# Patient Record
Sex: Male | Born: 1965 | Race: White | Hispanic: No | Marital: Married | Smoking: Never smoker
Health system: Southern US, Community
[De-identification: ages and names within clinical notes are randomized; demographics above are authoritative.]

## PROBLEM LIST (undated history)

## (undated) DIAGNOSIS — E785 Hyperlipidemia, unspecified: Secondary | ICD-10-CM

## (undated) DIAGNOSIS — M549 Dorsalgia, unspecified: Secondary | ICD-10-CM

## (undated) DIAGNOSIS — IMO0001 Reserved for inherently not codable concepts without codable children: Secondary | ICD-10-CM

## (undated) DIAGNOSIS — J309 Allergic rhinitis, unspecified: Secondary | ICD-10-CM

## (undated) DIAGNOSIS — E8881 Metabolic syndrome: Secondary | ICD-10-CM

## (undated) DIAGNOSIS — R7401 Elevation of levels of liver transaminase levels: Secondary | ICD-10-CM

## (undated) DIAGNOSIS — R600 Localized edema: Secondary | ICD-10-CM

## (undated) DIAGNOSIS — J329 Chronic sinusitis, unspecified: Secondary | ICD-10-CM

## (undated) DIAGNOSIS — R5381 Other malaise: Secondary | ICD-10-CM

## (undated) DIAGNOSIS — R740 Nonspecific elevation of levels of transaminase and lactic acid dehydrogenase [LDH]: Secondary | ICD-10-CM

## (undated) DIAGNOSIS — R5383 Other fatigue: Secondary | ICD-10-CM

## (undated) DIAGNOSIS — R7303 Prediabetes: Secondary | ICD-10-CM

## (undated) DIAGNOSIS — E119 Type 2 diabetes mellitus without complications: Secondary | ICD-10-CM

## (undated) DIAGNOSIS — I10 Essential (primary) hypertension: Secondary | ICD-10-CM

## (undated) DIAGNOSIS — M2550 Pain in unspecified joint: Secondary | ICD-10-CM

## (undated) DIAGNOSIS — M160 Bilateral primary osteoarthritis of hip: Secondary | ICD-10-CM

## (undated) HISTORY — DX: Chronic sinusitis, unspecified: J32.9

## (undated) HISTORY — DX: Metabolic syndrome: E88.81

## (undated) HISTORY — DX: Nonspecific elevation of levels of transaminase and lactic acid dehydrogenase (ldh): R74.0

## (undated) HISTORY — DX: Hyperlipidemia, unspecified: E78.5

## (undated) HISTORY — DX: Elevation of levels of liver transaminase levels: R74.01

## (undated) HISTORY — DX: Type 2 diabetes mellitus without complications: E11.9

## (undated) HISTORY — DX: Prediabetes: R73.03

## (undated) HISTORY — DX: Other fatigue: R53.83

## (undated) HISTORY — DX: Bilateral primary osteoarthritis of hip: M16.0

## (undated) HISTORY — PX: SHOULDER SURGERY: SHX246

## (undated) HISTORY — DX: Dorsalgia, unspecified: M54.9

## (undated) HISTORY — DX: Reserved for inherently not codable concepts without codable children: IMO0001

## (undated) HISTORY — PX: TOTAL HIP ARTHROPLASTY: SHX124

## (undated) HISTORY — DX: Essential (primary) hypertension: I10

## (undated) HISTORY — DX: Localized edema: R60.0

## (undated) HISTORY — DX: Pain in unspecified joint: M25.50

## (undated) HISTORY — DX: Other malaise: R53.81

## (undated) HISTORY — DX: Allergic rhinitis, unspecified: J30.9

---

## 1998-06-07 ENCOUNTER — Ambulatory Visit (HOSPITAL_COMMUNITY): Admission: RE | Admit: 1998-06-07 | Discharge: 1998-06-07 | Payer: Self-pay | Admitting: Gynecology

## 2003-07-23 ENCOUNTER — Encounter: Payer: Self-pay | Admitting: Internal Medicine

## 2004-09-05 ENCOUNTER — Ambulatory Visit: Payer: Self-pay | Admitting: Internal Medicine

## 2005-07-17 ENCOUNTER — Ambulatory Visit: Payer: Self-pay | Admitting: Internal Medicine

## 2005-08-14 ENCOUNTER — Ambulatory Visit: Payer: Self-pay | Admitting: Internal Medicine

## 2006-05-20 ENCOUNTER — Ambulatory Visit: Payer: Self-pay | Admitting: Internal Medicine

## 2006-09-01 ENCOUNTER — Ambulatory Visit: Payer: Self-pay | Admitting: Internal Medicine

## 2007-01-03 ENCOUNTER — Ambulatory Visit: Payer: Self-pay | Admitting: Internal Medicine

## 2007-05-12 DIAGNOSIS — I10 Essential (primary) hypertension: Secondary | ICD-10-CM | POA: Insufficient documentation

## 2007-05-12 DIAGNOSIS — J309 Allergic rhinitis, unspecified: Secondary | ICD-10-CM

## 2007-05-12 HISTORY — DX: Allergic rhinitis, unspecified: J30.9

## 2007-10-21 ENCOUNTER — Ambulatory Visit: Payer: Self-pay | Admitting: Internal Medicine

## 2008-02-17 ENCOUNTER — Ambulatory Visit: Payer: Self-pay | Admitting: Internal Medicine

## 2008-02-17 ENCOUNTER — Telehealth: Payer: Self-pay | Admitting: *Deleted

## 2008-03-22 ENCOUNTER — Ambulatory Visit: Payer: Self-pay | Admitting: Internal Medicine

## 2008-03-22 LAB — CONVERTED CEMR LAB
Albumin: 4.2 g/dL (ref 3.5–5.2)
Bilirubin Urine: NEGATIVE
Bilirubin, Direct: 0.1 mg/dL (ref 0.0–0.3)
CO2: 30 meq/L (ref 19–32)
Cholesterol: 215 mg/dL (ref 0–200)
Creatinine, Ser: 1.1 mg/dL (ref 0.4–1.5)
Direct LDL: 150 mg/dL
Eosinophils Absolute: 0.1 10*3/uL (ref 0.0–0.7)
Eosinophils Relative: 1.4 % (ref 0.0–5.0)
Glucose, Urine, Semiquant: NEGATIVE
HDL: 37.2 mg/dL — ABNORMAL LOW (ref >39.0)
Ketones, urine, test strip: NEGATIVE
Monocytes Absolute: 0.6 10*3/uL (ref 0.1–1.0)
Nitrite: NEGATIVE
Potassium: 4.3 meq/L (ref 3.5–5.1)
Protein, U semiquant: NEGATIVE
Sodium: 140 meq/L (ref 135–145)
Specific Gravity, Urine: <1.005
TSH: 1.94 microintl units/mL (ref 0.35–5.50)
Total Bilirubin: 0.7 mg/dL (ref 0.3–1.2)
Triglycerides: 193 mg/dL — ABNORMAL HIGH (ref 0–149)
Urobilinogen, UA: 0.2
WBC Urine, dipstick: NEGATIVE
WBC: 8.6 10*3/uL (ref 4.5–10.5)

## 2008-03-30 ENCOUNTER — Ambulatory Visit: Payer: Self-pay | Admitting: Internal Medicine

## 2008-07-18 ENCOUNTER — Emergency Department (HOSPITAL_BASED_OUTPATIENT_CLINIC_OR_DEPARTMENT_OTHER): Admission: EM | Admit: 2008-07-18 | Discharge: 2008-07-18 | Payer: Self-pay | Admitting: Emergency Medicine

## 2008-07-23 ENCOUNTER — Ambulatory Visit: Payer: Self-pay | Admitting: Internal Medicine

## 2008-07-23 LAB — CONVERTED CEMR LAB
Alkaline Phosphatase: 59 units/L (ref 39–117)
Cholesterol: 215 mg/dL (ref 0–200)
Direct LDL: 160.1 mg/dL
Total CHOL/HDL Ratio: 6.3
Triglycerides: 112 mg/dL (ref 0–149)
VLDL: 22 mg/dL (ref 0–40)

## 2008-07-30 ENCOUNTER — Ambulatory Visit: Payer: Self-pay | Admitting: Internal Medicine

## 2008-07-30 DIAGNOSIS — E785 Hyperlipidemia, unspecified: Secondary | ICD-10-CM

## 2009-01-28 ENCOUNTER — Ambulatory Visit: Payer: Self-pay | Admitting: Internal Medicine

## 2009-01-29 LAB — CONVERTED CEMR LAB
Albumin: 4.2 g/dL (ref 3.5–5.2)
Alkaline Phosphatase: 68 units/L (ref 39–117)
Bilirubin, Direct: 0.1 mg/dL (ref 0.0–0.3)
CO2: 27 meq/L (ref 19–32)
Cholesterol: 218 mg/dL — ABNORMAL HIGH (ref 0–200)
Creatinine, Ser: 1.3 mg/dL (ref 0.4–1.5)
GFR calc non Af Amer: 64.83 mL/min (ref >60)
Total Protein: 6.9 g/dL (ref 6.0–8.3)
VLDL: 21 mg/dL (ref 0.0–40.0)

## 2009-03-13 ENCOUNTER — Telehealth: Payer: Self-pay | Admitting: Internal Medicine

## 2009-09-14 DIAGNOSIS — R5381 Other malaise: Secondary | ICD-10-CM

## 2009-09-14 DIAGNOSIS — R5383 Other fatigue: Secondary | ICD-10-CM

## 2009-09-14 HISTORY — DX: Other fatigue: R53.83

## 2009-09-14 HISTORY — DX: Other malaise: R53.81

## 2011-02-21 LAB — PULMONARY FUNCTION TEST

## 2011-09-03 ENCOUNTER — Ambulatory Visit (INDEPENDENT_AMBULATORY_CARE_PROVIDER_SITE_OTHER): Payer: Self-pay | Admitting: Family Medicine

## 2011-09-03 ENCOUNTER — Encounter: Payer: Self-pay | Admitting: Family Medicine

## 2011-09-03 DIAGNOSIS — E785 Hyperlipidemia, unspecified: Secondary | ICD-10-CM

## 2011-09-03 DIAGNOSIS — I10 Essential (primary) hypertension: Secondary | ICD-10-CM

## 2011-09-03 MED ORDER — AMLODIPINE BESYLATE 10 MG PO TABS: 10.0000 mg | ORAL_TABLET | Freq: Every day | ORAL | Status: DC

## 2011-09-03 NOTE — Progress Notes (Addendum)
Office Note 09/06/2011  CC:  Chief Complaint  Patient presents with  . Establish Care    new patient    HPI:  Vernon Mccormick is a 45 y.o. White male who is here to establish care. Patient's most recent primary MD: Synthia Innocent, [place] in [place].  Prior to that he saw Dr. Phoebe Sharps with [facility] on [address] briefly. Old records in [facility] EMR were reviewed prior to or during today's visit.  Presents today saying he had been treated for HTN with lisinopril/HCTZ.  He reports tolerating this med for several years before developing generalized muscle soreness about 1 yr ago, which he attributes to the med.  He finally stopped the med 07/2011 and says that ALL of his muscle soreness has resolved! Denies unexplained fevers, wt loss, HA's, rash, or joint swelling/erythema.  Has not been any other med during this time.  Says bp was well controlled while on lisin/hctz.  Reports having normal EKG 02/2011.  Says he thinks he had Tdap in 2009.  Has had flu vaccine this season.  Past Medical History  Diagnosis Date  . Hypertension   . Hyperlipidemia   . Allergic rhinitis    PSH: none No past surgical history on file.  Family History  Problem Relation Age of Onset  . Hypertension Father   . Stroke Paternal Grandfather   . Heart disease Paternal Grandfather   . Hypertension Paternal Grandfather   . Diabetes Paternal Grandfather   . Kidney disease Paternal Grandfather     History   Social History  . Marital Status: Married    Spouse Name: N/A    Number of Children: N/A  . Years of Education: N/A   Occupational History  . Not on file.   Social History Main Topics  . Smoking status: Never Smoker   . Smokeless tobacco: Former Systems developer    Types: Chew  . Alcohol Use: Yes     socially  . Drug Use: No  . Sexually Active: Not on file   Other Topics Concern  . Not on file   Social History Narrative   Married, 2 kids.Long time local resident, grad of [employer].  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  Rare/social ETOH.  No drugs.   MEDS: currently none (amlodipine added AFTER today's encounter). Outpatient Encounter Prescriptions as of 09/03/2011  Medication Sig Dispense Refill  . amLODipine (NORVASC) 10 MG tablet Take 1 tablet (10 mg total) by mouth daily.  30 tablet  0    Allergies  Allergen Reactions  . Lisinopril     Aches and pains    ROS Review of Systems  Constitutional: Negative for fever, weight loss and malaise/fatigue.  HENT: Negative for hearing loss, nosebleeds, sore throat, neck pain and tinnitus.   Eyes: Negative for blurred vision and pain.  Respiratory: Negative for cough, shortness of breath and wheezing.   Cardiovascular: Negative for chest pain, palpitations, claudication and PND.  Gastrointestinal: Negative for nausea, diarrhea, constipation and blood in stool.  Genitourinary: Negative for urgency, hematuria and flank pain.  Musculoskeletal: Positive for myalgias (as per HPI--now resolved). Negative for back pain and joint pain.  Skin: Negative for rash.  Neurological: Negative for dizziness, seizures, weakness and headaches.  Endo/Heme/Allergies: Does not bruise/bleed easily.  Psychiatric/Behavioral: Negative for depression and memory loss.   b PE; Blood pressure 160/105, pulse 64, temperature 98.3 F (36.8 C), temperature source Oral, height 5' 11"  (1.803 m), weight 253 lb 12.8 oz (115.123 kg),  SpO2 98.00%. Gen: Alert, well appearing.  Patient is oriented to person, place, time, and situation. ENT: Ears: EACs clear, normal epithelium.  TMs with good light reflex and landmarks bilaterally.  Eyes: no injection, icteris, swelling, or exudate.  EOMI, PERRLA. Nose: no drainage or turbinate edema/swelling.  No injection or focal lesion.  Mouth: lips without lesion/swelling.  Oral mucosa pink and moist.  Dentition intact and without obvious caries or gingival swelling.  Oropharynx without erythema, exudate,  or swelling.  Neck - No masses or thyromegaly or limitation in range of motion CV: RRR, no m/r/g.   LUNGS: CTA bilat, nonlabored resps, good aeration in all lung fields. ABD: soft, NT, ND, BS normal.  No hepatospenomegaly or mass.  No bruits. EXT: no clubbing, cyanosis, or edema.  Skin - no sores or suspicious lesions or rashes or color changes  Pertinent labs:  None today  ASSESSMENT AND PLAN:   New pt: obtain old records.  HYPERTENSION Poor control.  Offf meds currently. Needs change in med due to question of side effect from lisinopril/HCTZ. Start amlodipine 58m qd.   No ASA for now.  He'll call with report of outside bp's in 7-10 d (bp goal <140/90).  HYPERLIPIDEMIA Lipids 01/2009 showed LDL a bit over and HDL a bit under his goals of 160 and 40, respectively, plus his AST and ALT were mildly elevated. However, he likely has more UTD labs via his records with JSynthia Innocent FNP.  Obtain old records.   No labs today.     Myalgias: resolved, believed to be EXTREMELY delayed side effect of lisin/hctz (?).  Return for 10-14d f/u for HTN.    ADDENDUM 09/14/11: [place] given his CV risk factors. At his next f/u I will recommend advanced lipid testing to see if any additional risk picked up on these tests.  Otherwise, emphasize TLC and recheck lipids 1 yr.--PM

## 2011-09-06 ENCOUNTER — Telehealth: Payer: Self-pay | Admitting: Family Medicine

## 2011-09-06 ENCOUNTER — Encounter: Payer: Self-pay | Admitting: Family Medicine

## 2011-09-06 NOTE — Assessment & Plan Note (Signed)
Lipids 01/2009 showed LDL a bit over and HDL a bit under his goals of 160 and 40, respectively, plus his AST and ALT were mildly elevated. However, he likely has more UTD labs via his records with Synthia Innocent, FNP.  Obtain old records.   No labs today.

## 2011-09-06 NOTE — Assessment & Plan Note (Signed)
Poor control.  Offf meds currently. Needs change in med due to question of side effect from lisinopril/HCTZ. Start amlodipine 67m qd.   No ASA for now.  He'll call with report of outside bp's in 7-10 d (bp goal <140/90).

## 2011-09-06 NOTE — Telephone Encounter (Signed)
Pls request records from Synthia Innocent, FNP ([place]).Marland Kitchen

## 2011-09-10 NOTE — Telephone Encounter (Signed)
Faxed 09/10/11

## 2011-09-14 ENCOUNTER — Encounter: Payer: Self-pay | Admitting: Family Medicine

## 2011-09-17 ENCOUNTER — Ambulatory Visit (INDEPENDENT_AMBULATORY_CARE_PROVIDER_SITE_OTHER): Payer: BC Managed Care – PPO | Admitting: Family Medicine

## 2011-09-17 ENCOUNTER — Encounter: Payer: Self-pay | Admitting: Family Medicine

## 2011-09-17 VITALS — BP 138/100 | HR 59 | Temp 97.6°F | Wt 254.0 lb

## 2011-09-17 DIAGNOSIS — I10 Essential (primary) hypertension: Secondary | ICD-10-CM

## 2011-09-17 DIAGNOSIS — R609 Edema, unspecified: Secondary | ICD-10-CM

## 2011-09-17 DIAGNOSIS — R3129 Other microscopic hematuria: Secondary | ICD-10-CM

## 2011-09-17 DIAGNOSIS — E785 Hyperlipidemia, unspecified: Secondary | ICD-10-CM

## 2011-09-17 LAB — CBC WITH DIFFERENTIAL/PLATELET
Basophils Absolute: 0 10*3/uL (ref 0.0–0.1)
Basophils Relative: 0.5 % (ref 0.0–3.0)
Eosinophils Absolute: 0.1 10*3/uL (ref 0.0–0.7)
Eosinophils Relative: 1.6 % (ref 0.0–5.0)
Hemoglobin: 16.8 g/dL (ref 13.0–17.0)
MCHC: 33.5 g/dL (ref 30.0–36.0)
Monocytes Absolute: 0.4 10*3/uL (ref 0.1–1.0)
Monocytes Relative: 5.5 % (ref 3.0–12.0)
Neutro Abs: 4.5 10*3/uL (ref 1.4–7.7)
Neutrophils Relative %: 57.2 % (ref 43.0–77.0)
Platelets: 203 10*3/uL (ref 150.0–400.0)
RBC: 5.71 Mil/uL (ref 4.22–5.81)
RDW: 13.3 % (ref 11.5–14.6)
WBC: 7.8 10*3/uL (ref 4.5–10.5)

## 2011-09-17 LAB — POCT URINALYSIS DIPSTICK
Bilirubin, UA: NEGATIVE
Leukocytes, UA: NEGATIVE
Nitrite, UA: NEGATIVE
Urobilinogen, UA: 0.2

## 2011-09-17 LAB — URINALYSIS, ROUTINE W REFLEX MICROSCOPIC
Bilirubin Urine: NEGATIVE
Ketones, ur: NEGATIVE
Leukocytes, UA: NEGATIVE
Nitrite: NEGATIVE
Specific Gravity, Urine: >=1.03 (ref 1.000–1.030)
Urine Glucose: NEGATIVE
pH: 6 (ref 5.0–8.0)

## 2011-09-17 LAB — COMPREHENSIVE METABOLIC PANEL
Albumin: 4.3 g/dL (ref 3.5–5.2)
Creatinine, Ser: 1 mg/dL (ref 0.4–1.5)
GFR: 87.65 mL/min (ref >60.00)
Total Protein: 6.9 g/dL (ref 6.0–8.3)

## 2011-09-17 LAB — LIPID PANEL
Triglycerides: 82 mg/dL (ref 0.0–149.0)
VLDL: 16.4 mg/dL (ref 0.0–40.0)

## 2011-09-17 MED ORDER — LOSARTAN POTASSIUM 50 MG PO TABS: 50.0000 mg | ORAL_TABLET | Freq: Every day | ORAL | Status: DC

## 2011-09-17 NOTE — Progress Notes (Signed)
OFFICE NOTE  09/18/2011  CC:  Chief Complaint  Patient presents with  . Follow-up    hypertension     HPI:   Patient is a 46 y.o. Caucasian male who is here for f/u HTN, started amlodipine 43m qd about 2 wks ago. About 4-5 days after starting it, he went on a vacation and walked a lot and noted a lot of swelling and a mildly itchy pinkish rash come out on both lower legs.  He stopped the amlodipine yesterday morning.  Home bp checks while on the med were still high: 140s-150s over 80s-90s.  He is still having some HA's.  Pertinent PMH:  Past Medical History  Diagnosis Date  . Hypertension   . Hyperlipidemia   . Allergic rhinitis   . Other malaise and fatigue 2011    Testosterone normal 2011     MEDS;   Outpatient Prescriptions Prior to Visit  Medication Sig Dispense Refill  . amLODipine (NORVASC) 10 MG tablet Take 1 tablet (10 mg total) by mouth daily.  30 tablet  0    PE: Blood pressure 138/100, pulse 59, temperature 97.6 F (36.4 C), temperature source Temporal, weight 254 lb (115.214 kg). Gen: Alert, well appearing.  Patient is oriented to person, place, time, and situation. ENT: Ears: EACs clear, normal epithelium.  TMs with good light reflex and landmarks bilaterally.  Eyes: no injection, icteris, swelling, or exudate.  EOMI, PERRLA. Nose: no drainage or turbinate edema/swelling.  No injection or focal lesion.  Mouth: lips without lesion/swelling.  Oral mucosa pink and moist.  Dentition intact and without obvious caries or gingival swelling.  Oropharynx without erythema, exudate, or swelling.  Neck - No masses or thyromegaly or limitation in range of motion CV: RRR, no m/r/g.   LUNGS: CTA bilat, nonlabored resps, good aeration in all lung fields. ABD: soft, NT EXT: 1-2 + pitting edema from the knees down into feet bilat, also on medial ankles he has a nonblanching pinkish splotchy rash--appears petechial. Nontender.  No ecchymoses, no nodules or pustules or  hives.  LAB: CC UA today: small blood, SG>1.030, otherwise normal/negative  IMPRESSION AND PLAN:  HYPERTENSION BP not well controlled on amlodipine PLUS he had adverse effects of LE edema and rash. D/C amlodipine and start cozaar generic 524monce daily. Monitor bp.  Recheck in office in 2 wks to make sure bp improving and hopefully document resolution of edema and rash.  HYPERLIPIDEMIA Discussed the fact that he is essentially at goal (per old record chart review) and I recommend advanced lipid testing and he is agreeable to this. He also wants routine CBC, CMEt, and TSH today.  Microscopic hematuria On dipstick in office here today. Will send sample for UA with micro to confirm.     FOLLOW UP:  Return in about 2 weeks (around 10/01/2011) for f/u HTN and LE swelling/rash.

## 2011-09-18 ENCOUNTER — Ambulatory Visit: Payer: BC Managed Care – PPO | Admitting: Family Medicine

## 2011-09-18 DIAGNOSIS — R3129 Other microscopic hematuria: Secondary | ICD-10-CM | POA: Insufficient documentation

## 2011-09-18 NOTE — Assessment & Plan Note (Signed)
BP not well controlled on amlodipine PLUS he had adverse effects of LE edema and rash. D/C amlodipine and start cozaar generic 10m once daily. Monitor bp.  Recheck in office in 2 wks to make sure bp improving and hopefully document resolution of edema and rash.

## 2011-09-18 NOTE — Assessment & Plan Note (Signed)
Discussed the fact that he is essentially at goal (per old record chart review) and I recommend advanced lipid testing and he is agreeable to this. He also wants routine CBC, CMEt, and TSH today.

## 2011-09-18 NOTE — Assessment & Plan Note (Signed)
On dipstick in office here today. Will send sample for UA with micro to confirm.

## 2011-09-27 ENCOUNTER — Telehealth: Payer: Self-pay | Admitting: Family Medicine

## 2011-09-27 NOTE — Telephone Encounter (Signed)
Pls notify pt that his advanced lipid testing showed some interesting results.  He has excessive cardiovascular risk and my recommendations are:  1) Start simvastatin 58m qd, recheck lipids 340mogoal LDL <100, goal HDL >50, and goal triglycerides <100). 2) Start Aspirin 8134m"baby" aspirin).  IF he already takes this (chart doesn't show this) then increase to a 325m92mpirin tab once daily. We'll repeat the urine test he did with his recent advanced lipid testing in 3 mo. 3) His particular genetic testing indicated that a low fat and low cholesterol diet are even MORE important for him than other people, and that Fish oil supplements may actually be BAD for him (it may increase his "bad cholesterol" too much).  Also, exercise, exercise, exercise! If he agrees to simvastatin, please eRx this for him: 20mg97mqd, #30, RF x 2.  thx--

## 2011-09-28 MED ORDER — SIMVASTATIN 20 MG PO TABS: 20.0000 mg | ORAL_TABLET | Freq: Every day | ORAL | Status: DC

## 2011-09-28 NOTE — Telephone Encounter (Signed)
I have attempted to contact this patient by phone with the following results: left message to return my call on answering machine (mobile).  

## 2011-09-28 NOTE — Telephone Encounter (Signed)
RC from pt.  He is agreeable with all recommendations.  He can discuss more at appt on Thursday.  RX for simvastatin sent to pharm.

## 2011-10-01 ENCOUNTER — Encounter: Payer: Self-pay | Admitting: Family Medicine

## 2011-10-01 ENCOUNTER — Ambulatory Visit (INDEPENDENT_AMBULATORY_CARE_PROVIDER_SITE_OTHER): Payer: BC Managed Care – PPO | Admitting: Family Medicine

## 2011-10-01 DIAGNOSIS — I10 Essential (primary) hypertension: Secondary | ICD-10-CM

## 2011-10-01 DIAGNOSIS — R3129 Other microscopic hematuria: Secondary | ICD-10-CM

## 2011-10-01 DIAGNOSIS — E785 Hyperlipidemia, unspecified: Secondary | ICD-10-CM

## 2011-10-01 DIAGNOSIS — IMO0001 Reserved for inherently not codable concepts without codable children: Secondary | ICD-10-CM

## 2011-10-01 DIAGNOSIS — E669 Obesity, unspecified: Secondary | ICD-10-CM

## 2011-10-01 DIAGNOSIS — R609 Edema, unspecified: Secondary | ICD-10-CM

## 2011-10-01 NOTE — Progress Notes (Signed)
OFFICE NOTE  10/03/2011  CC:  Chief Complaint  Patient presents with  . Follow-up    BP, rash better     HPI:   Patient is a 46 y.o. Caucasian male who is here for 2 wk HTN and hyperlipidemia f/u. Changed bp med to losartan last visit due to amlodipine causing LE swelling/rash.  The swelling and rash are completely gone now since getting off amlodipine.  No SE's from losartan. BP at home 130s/80s-90s. Started zocor just recently when HDL labs returned showing excessive CV risk. Discussed his weight in depth today, went over basics of lifestyle modifications that he can sustain.  Pertinent PMH:  Past Medical History  Diagnosis Date  . Hypertension   . Hyperlipidemia   . Allergic rhinitis   . Other malaise and fatigue 2011    Testosterone normal 2011   Past surgical, social, and family history reviewed and no changes noted since last office visit.  MEDS;   Outpatient Prescriptions Prior to Visit  Medication Sig Dispense Refill  . losartan (COZAAR) 50 MG tablet Take 1 tablet (50 mg total) by mouth daily.  30 tablet  1  . simvastatin (ZOCOR) 20 MG tablet Take 1 tablet (20 mg total) by mouth at bedtime.  30 tablet  2    PE: Blood pressure 131/93, pulse 63, height 5' 11"  (1.803 m), weight 253 lb (114.76 kg). Gen: Alert, well appearing.  Patient is oriented to person, place, time, and situation. CV: RRR, no m/r/g.   LUNGS: CTA bilat, nonlabored resps, good aeration in all lung fields. EXT: no clubbing, cyanosis, or edema.  No rash.  IMPRESSION AND PLAN:  HYPERTENSION Much improved. Continue current med.  Continue home monitoring.  Edema Side effect of amlodipine--resolved off this med.  Microscopic hematuria Discussed in depth today. We'll repeat at next f/u in 32moto see if we can establish that it is persistent before proceeding with any further w/u.  HYPERLIPIDEMIA Recently started simvastatin and he's tolerating this fine. Recheck lipids 350mo Discussed  dietary/lifestyle changes.  Obesity, Class II, BMI 35-39.9, with comorbidity Discussed goal: wt loss of 33 lbs over the next year or so (goal wt of 220 lbs).     FOLLOW UP:  Return in about 3 months (around 12/30/2011) for f/u HTN and hyperlip--needs fasting AM appt.

## 2011-10-03 DIAGNOSIS — IMO0001 Reserved for inherently not codable concepts without codable children: Secondary | ICD-10-CM | POA: Insufficient documentation

## 2011-10-03 NOTE — Assessment & Plan Note (Signed)
Recently started simvastatin and he's tolerating this fine. Recheck lipids 22mo  Discussed dietary/lifestyle changes.

## 2011-10-03 NOTE — Assessment & Plan Note (Signed)
Side effect of amlodipine--resolved off this med.

## 2011-10-03 NOTE — Assessment & Plan Note (Signed)
Much improved. Continue current med.  Continue home monitoring.

## 2011-10-03 NOTE — Assessment & Plan Note (Signed)
Discussed goal: wt loss of 33 lbs over the next year or so (goal wt of 220 lbs).

## 2011-10-03 NOTE — Assessment & Plan Note (Signed)
Discussed in depth today. We'll repeat at next f/u in 16moto see if we can establish that it is persistent before proceeding with any further w/u.

## 2011-10-05 ENCOUNTER — Encounter: Payer: Self-pay | Admitting: Family Medicine

## 2011-11-02 ENCOUNTER — Encounter: Payer: Self-pay | Admitting: Family Medicine

## 2011-11-02 ENCOUNTER — Ambulatory Visit (INDEPENDENT_AMBULATORY_CARE_PROVIDER_SITE_OTHER): Payer: BC Managed Care – PPO | Admitting: Family Medicine

## 2011-11-02 VITALS — BP 132/90 | HR 71 | Temp 97.5°F | Ht 71.0 in | Wt 260.0 lb

## 2011-11-02 DIAGNOSIS — J019 Acute sinusitis, unspecified: Secondary | ICD-10-CM

## 2011-11-02 MED ORDER — AMOXICILLIN 875 MG PO TABS: 875.0000 mg | ORAL_TABLET | Freq: Two times a day (BID) | ORAL | Status: AC

## 2011-11-02 NOTE — Assessment & Plan Note (Signed)
Amoxil 833m bid x 10d. Saline nasal spray 2-3 times per day encouraged. OTC nonsedating antihistamine w/out decongestant recommended.

## 2011-11-02 NOTE — Patient Instructions (Signed)
Buy small box of allegra 126m OR zyrtec 146m OR claritin 1059mnd take as instructed. If cough: take mucinex DM or robitussin DM as instructed.

## 2011-11-02 NOTE — Progress Notes (Signed)
OFFICE NOTE  11/02/2011  CC:  Chief Complaint  Patient presents with  . URI    x 1.5 weeks, nasal drainage, not taking any meds, no fever, feels like moving into chest     HPI: Patient is a 46 y.o. Caucasian male who is here for "sick". Pt presents complaining of respiratory symptoms for 11  days.  Mostly nasal congestion/runny nose, sneezing,  occ PND cough.  Worst symptoms seems to be the facial/nasal congestion.  Lately the symptoms seem to be staying same.  No signif cough. No fevers, no wheezing, and no SOB.  No pain in face or teeth.  No significant HA.  ST mild at most.  Symptoms made worse by nothing.  Symptoms improved by hot shower.. Smoker? no Recent sick contact? no Muscle or joint aches? no Flu shot this season at least 2 wks ago? yes  ROS: no n/v/d or abdominal pain.  No rash.  No neck stiffness.   +Mild fatigue.  +Mild appetite loss.   Pertinent PMH:  Past Medical History  Diagnosis Date  . Hypertension   . Hyperlipidemia   . Allergic rhinitis   . Other malaise and fatigue 2011    Testosterone normal 2011   Past surgical, social, and family history reviewed and no changes noted since last office visit.  MEDS:  Outpatient Prescriptions Prior to Visit  Medication Sig Dispense Refill  . losartan (COZAAR) 50 MG tablet Take 1 tablet (50 mg total) by mouth daily.  30 tablet  1  . simvastatin (ZOCOR) 20 MG tablet Take 1 tablet (20 mg total) by mouth at bedtime.  30 tablet  2    PE: Blood pressure 132/90, pulse 71, temperature 97.5 F (36.4 C), temperature source Oral, height 5' 11"  (1.803 m), weight 260 lb (117.935 kg), SpO2 95.00%. VS: noted--normal. Gen: alert, NAD, NONTOXIC APPEARING. HEENT: eyes without injection, drainage, or swelling.  Ears: EACs clear, TMs with normal light reflex and landmarks.  Nose: Clear rhinorrhea, with some dried, crusty exudate adherent to mildly injected mucosa.  No purulent d/c.  +maxillary sinus TTP , L>R.  No facial swelling.   Throat and mouth without focal lesion.  No pharyngial swelling, erythema, or exudate.   Neck: supple, no LAD.   LUNGS: CTA bilat, nonlabored resps.   CV: RRR, no m/r/g. EXT: no c/c/e SKIN: no rash    IMPRESSION AND PLAN: Sinusitis acute Amoxil 853m bid x 10d. Saline nasal spray 2-3 times per day encouraged. OTC nonsedating antihistamine w/out decongestant recommended.      FOLLOW UP: prn

## 2011-11-13 ENCOUNTER — Encounter: Payer: Self-pay | Admitting: Family Medicine

## 2011-11-13 ENCOUNTER — Other Ambulatory Visit: Payer: Self-pay | Admitting: *Deleted

## 2011-11-13 ENCOUNTER — Ambulatory Visit (INDEPENDENT_AMBULATORY_CARE_PROVIDER_SITE_OTHER): Payer: BC Managed Care – PPO | Admitting: Family Medicine

## 2011-11-13 VITALS — BP 143/99 | HR 71 | Temp 98.2°F | Ht 71.0 in | Wt 259.0 lb

## 2011-11-13 DIAGNOSIS — R3129 Other microscopic hematuria: Secondary | ICD-10-CM

## 2011-11-13 DIAGNOSIS — E785 Hyperlipidemia, unspecified: Secondary | ICD-10-CM

## 2011-11-13 DIAGNOSIS — R609 Edema, unspecified: Secondary | ICD-10-CM

## 2011-11-13 DIAGNOSIS — J329 Chronic sinusitis, unspecified: Secondary | ICD-10-CM

## 2011-11-13 DIAGNOSIS — I10 Essential (primary) hypertension: Secondary | ICD-10-CM

## 2011-11-13 MED ORDER — HYDROCHLOROTHIAZIDE 25 MG PO TABS: 25.0000 mg | ORAL_TABLET | Freq: Every day | ORAL | Status: DC

## 2011-11-13 MED ORDER — LOSARTAN POTASSIUM 50 MG PO TABS: 50.0000 mg | ORAL_TABLET | Freq: Every day | ORAL | Status: DC

## 2011-11-13 MED ORDER — AMOXICILLIN-POT CLAVULANATE 875-125 MG PO TABS: 1.0000 | ORAL_TABLET | Freq: Two times a day (BID) | ORAL | Status: AC

## 2011-11-13 NOTE — Telephone Encounter (Signed)
Pt has follow up 12/2011.  RX sent x 2.

## 2011-11-13 NOTE — Progress Notes (Signed)
OFFICE NOTE  11/13/2011  CC:  Chief Complaint  Patient presents with  . Sinusitis    no better     HPI: Patient is a 46 y.o. Caucasian male who is here for 2 wk f/u sinusitis, "no better". Two weeks ago I dx'd him with sinusitis and rx'd 10d course of amoxil 559m tid.  Took all meds, was about 50% improved but then last night got acutely worse: purulent nasal mucous/face pain, PND cough, tired. No fever.  No wheezing or SOB.  Of note, has hx of musculoskeletal pain that was thought to be from his lisinopril/hctz combo pill in the past, so he was switched to cozaar and sx's abated.  Says HCTZ helped bp a lot in the past. Home bp checks consistent with today's check interspersed with some in normal range. No HA, no CP, no palpitations, no dizziness.  Pertinent PMH:  Recurrent sinusitis HTN Hyperlipidemia Obesity, BMI 35-39.9 No hx of tobacco abuse  MEDS:  Cozaar 533mqd, simvastatin 2078md  PE: Blood pressure 143/99, pulse 71, temperature 98.2 F (36.8 C), temperature source Temporal, height 5' 11"  (1.803 m), weight 259 lb (117.482 kg). VS: noted--normal. Gen: alert, NAD, well appearing. HEENT: eyes without injection, drainage, or swelling.  Ears: EACs clear, TMs with normal light reflex and landmarks.  Nose:Some dried, crusty exudate adherent to mildly injected mucosa.  No purulent d/c. Mild diffuse paranasal sinus TTP.  No facial swelling.  Throat and mouth without focal lesion.  No pharyngial swelling, erythema, or exudate.   Neck: supple, no LAD.   LUNGS: CTA bilat, nonlabored resps.   CV: RRR, no m/r/g. EXT: no c/c/e SKIN: no rash  IMPRESSION AND PLAN:  Recurrent sinusitis Augmentin 875m10md x 14d. Saline nasal mist encouraged.  Avoid decongestants due to HTN.  Hypertension Control not ideal.  Will add HCTZ 25mg21mk on.  If he tolerates this well and bp well controlled, we can get him on combo cozaar/hctz pill. Cont. Heart healthy diet, increase  exercise.      FOLLOW UP: already has f/u scheduled for April 2013.

## 2011-11-16 DIAGNOSIS — J329 Chronic sinusitis, unspecified: Secondary | ICD-10-CM

## 2011-11-16 HISTORY — DX: Chronic sinusitis, unspecified: J32.9

## 2011-11-16 NOTE — Assessment & Plan Note (Signed)
Augmentin 852m bid x 14d. Saline nasal mist encouraged.  Avoid decongestants due to HTN.

## 2011-11-16 NOTE — Assessment & Plan Note (Signed)
Control not ideal.  Will add HCTZ 46m back on.  If he tolerates this well and bp well controlled, we can get him on combo cozaar/hctz pill. Cont. Heart healthy diet, increase exercise.

## 2011-12-14 ENCOUNTER — Other Ambulatory Visit: Payer: Self-pay | Admitting: *Deleted

## 2011-12-14 MED ORDER — SIMVASTATIN 20 MG PO TABS: 20.0000 mg | ORAL_TABLET | Freq: Every day | ORAL | Status: DC

## 2011-12-14 MED ORDER — HYDROCHLOROTHIAZIDE 25 MG PO TABS: 25.0000 mg | ORAL_TABLET | Freq: Every day | ORAL | Status: DC

## 2011-12-14 NOTE — Telephone Encounter (Signed)
Faxed refill request received from pharmacy for Simvastatin. Last filled by MD on 09/28/11, 30 x 2 Last seen on 11/13/11  Follow up on 12/29/11.  RX sent.

## 2011-12-14 NOTE — Telephone Encounter (Signed)
Faxed refill request received from pharmacy for HCTZ Last filled by MD on 11/13/11, #30 x 0 Last seen on 11/13/11 Follow up on 12/29/11. RX sent.

## 2011-12-29 ENCOUNTER — Ambulatory Visit (INDEPENDENT_AMBULATORY_CARE_PROVIDER_SITE_OTHER): Payer: BC Managed Care – PPO | Admitting: Family Medicine

## 2011-12-29 ENCOUNTER — Encounter: Payer: Self-pay | Admitting: Family Medicine

## 2011-12-29 VITALS — BP 133/89 | HR 67 | Ht 71.0 in | Wt 258.0 lb

## 2011-12-29 DIAGNOSIS — R609 Edema, unspecified: Secondary | ICD-10-CM

## 2011-12-29 DIAGNOSIS — R3129 Other microscopic hematuria: Secondary | ICD-10-CM

## 2011-12-29 DIAGNOSIS — J329 Chronic sinusitis, unspecified: Secondary | ICD-10-CM

## 2011-12-29 DIAGNOSIS — I10 Essential (primary) hypertension: Secondary | ICD-10-CM

## 2011-12-29 DIAGNOSIS — E785 Hyperlipidemia, unspecified: Secondary | ICD-10-CM

## 2011-12-29 LAB — BASIC METABOLIC PANEL
BUN: 15 mg/dL (ref 6–23)
CO2: 28 mEq/L (ref 19–32)
Chloride: 103 mEq/L (ref 96–112)
Creatinine, Ser: 1.1 mg/dL (ref 0.4–1.5)
Glucose, Bld: 95 mg/dL (ref 70–99)
Potassium: 3.6 mEq/L (ref 3.5–5.1)

## 2011-12-29 LAB — LIPID PANEL
Cholesterol: 166 mg/dL (ref 0–200)
LDL Cholesterol: 97 mg/dL (ref 0–99)
VLDL: 28.8 mg/dL (ref 0.0–40.0)

## 2011-12-29 MED ORDER — LOSARTAN POTASSIUM 50 MG PO TABS: 50.0000 mg | ORAL_TABLET | Freq: Every day | ORAL | Status: DC

## 2011-12-29 MED ORDER — HYDROCHLOROTHIAZIDE 25 MG PO TABS: 25.0000 mg | ORAL_TABLET | Freq: Every day | ORAL | Status: DC

## 2011-12-29 NOTE — Progress Notes (Signed)
OFFICE NOTE  12/29/2011  CC:  Chief Complaint  Patient presents with  . Follow-up    HTN     HPI: Patient is a 46 y.o. Caucasian male who is here for 6 wk follow up HTN. Home bp measurements a few times in March showed 130's/80's.   Too busy to measure lately. Tolerating zocor w/out problem.  Due for repeat lipids today--he is fasting. Plans on starting diet plan soon.  Pertinent PMH:  Past Medical History  Diagnosis Date  . Hypertension   . Hyperlipidemia   . Allergic rhinitis   . Other malaise and fatigue 2011    Testosterone normal 2011   Past surgical, social, and family history reviewed and no changes noted since last office visit.  MEDS:  Outpatient Prescriptions Prior to Visit  Medication Sig Dispense Refill  . hydrochlorothiazide (HYDRODIURIL) 25 MG tablet Take 1 tablet (25 mg total) by mouth daily.  30 tablet  0  . losartan (COZAAR) 50 MG tablet Take 1 tablet (50 mg total) by mouth daily.  30 tablet  1  . simvastatin (ZOCOR) 20 MG tablet Take 1 tablet (20 mg total) by mouth at bedtime.  30 tablet  0    PE: Blood pressure 133/89, pulse 67, height 5' 11"  (1.803 m), weight 258 lb (117.028 kg). Gen: Alert, well appearing.  Patient is oriented to person, place, time, and situation. Neck - No masses or thyromegaly or limitation in range of motion.  Carotids 1+ bilat, no bruit. CV: RRR, no m/r/g.   LUNGS: CTA bilat, nonlabored resps, good aeration in all lung fields. EXT: no clubbing, cyanosis, or edema.   LABS: none today  IMPRESSION AND PLAN:  HYPERTENSION Control adequate. Continue 61m HCTZ qd and losartan 5549mqd. Push diet/exercise.  HYPERLIPIDEMIA Tolerating statin. Recheck lipid panel (fasting) today, AST, ALT. Push diet/exercise.  Recurrent sinusitis Much improved--sx's resolved after augmentin 6 wks ago.      FOLLOW UP:  49m33mou HTN/hyperlip

## 2011-12-29 NOTE — Assessment & Plan Note (Signed)
Tolerating statin. Recheck lipid panel (fasting) today, AST, ALT. Push diet/exercise.

## 2011-12-29 NOTE — Assessment & Plan Note (Signed)
Control adequate. Continue 37m HCTZ qd and losartan 572mqd. Push diet/exercise.

## 2011-12-29 NOTE — Assessment & Plan Note (Signed)
Much improved--sx's resolved after augmentin 6 wks ago.

## 2011-12-30 ENCOUNTER — Encounter: Payer: Self-pay | Admitting: Family Medicine

## 2011-12-30 DIAGNOSIS — R740 Nonspecific elevation of levels of transaminase and lactic acid dehydrogenase [LDH]: Secondary | ICD-10-CM

## 2012-01-06 ENCOUNTER — Telehealth: Payer: Self-pay

## 2012-01-06 NOTE — Progress Notes (Signed)
Theres a note in to dr Anitra Lauth

## 2012-01-06 NOTE — Progress Notes (Signed)
Pt called back but the results that were on Vernon Mccormick screen are gone when I noted and sent it to me?

## 2012-01-06 NOTE — Telephone Encounter (Signed)
Can you please readdress his labs? I left him a message to call him and when he called me back I couldn't find the message you wrote? Patient is aware that you are out of the office until Monday. Thank you

## 2012-01-06 NOTE — Progress Notes (Signed)
Ok, will do these viral hep screens at next f/u--no need to have blood draw again prior to that.--PM

## 2012-01-11 ENCOUNTER — Encounter (HOSPITAL_BASED_OUTPATIENT_CLINIC_OR_DEPARTMENT_OTHER): Payer: Self-pay | Admitting: *Deleted

## 2012-01-11 ENCOUNTER — Emergency Department (HOSPITAL_BASED_OUTPATIENT_CLINIC_OR_DEPARTMENT_OTHER)
Admission: EM | Admit: 2012-01-11 | Discharge: 2012-01-11 | Disposition: A | Discharge status: Home / Self Care | Payer: Worker's Compensation | Attending: Emergency Medicine | Admitting: Emergency Medicine

## 2012-01-11 ENCOUNTER — Emergency Department (INDEPENDENT_AMBULATORY_CARE_PROVIDER_SITE_OTHER): Payer: Worker's Compensation

## 2012-01-11 DIAGNOSIS — M25519 Pain in unspecified shoulder: Secondary | ICD-10-CM | POA: Insufficient documentation

## 2012-01-11 DIAGNOSIS — S46909A Unspecified injury of unspecified muscle, fascia and tendon at shoulder and upper arm level, unspecified arm, initial encounter: Secondary | ICD-10-CM | POA: Insufficient documentation

## 2012-01-11 DIAGNOSIS — M25419 Effusion, unspecified shoulder: Secondary | ICD-10-CM | POA: Insufficient documentation

## 2012-01-11 DIAGNOSIS — I10 Essential (primary) hypertension: Secondary | ICD-10-CM | POA: Insufficient documentation

## 2012-01-11 DIAGNOSIS — W19XXXA Unspecified fall, initial encounter: Secondary | ICD-10-CM

## 2012-01-11 DIAGNOSIS — S4980XA Other specified injuries of shoulder and upper arm, unspecified arm, initial encounter: Secondary | ICD-10-CM | POA: Insufficient documentation

## 2012-01-11 DIAGNOSIS — Z79899 Other long term (current) drug therapy: Secondary | ICD-10-CM | POA: Insufficient documentation

## 2012-01-11 DIAGNOSIS — Z7982 Long term (current) use of aspirin: Secondary | ICD-10-CM | POA: Insufficient documentation

## 2012-01-11 DIAGNOSIS — E785 Hyperlipidemia, unspecified: Secondary | ICD-10-CM | POA: Insufficient documentation

## 2012-01-11 DIAGNOSIS — W010XXA Fall on same level from slipping, tripping and stumbling without subsequent striking against object, initial encounter: Secondary | ICD-10-CM | POA: Insufficient documentation

## 2012-01-11 DIAGNOSIS — S4990XA Unspecified injury of shoulder and upper arm, unspecified arm, initial encounter: Secondary | ICD-10-CM

## 2012-01-11 DIAGNOSIS — IMO0001 Reserved for inherently not codable concepts without codable children: Secondary | ICD-10-CM | POA: Insufficient documentation

## 2012-01-11 MED ORDER — SIMVASTATIN 20 MG PO TABS: 20.0000 mg | ORAL_TABLET | Freq: Every day | ORAL | Status: DC

## 2012-01-11 MED ORDER — HYDROCODONE-ACETAMINOPHEN 5-325 MG PO TABS: 2.0000 | ORAL_TABLET | ORAL | Status: AC | PRN

## 2012-01-11 NOTE — ED Provider Notes (Signed)
History     CSN: [id number]  Arrival date & time 01/11/12  2055   First MD Initiated Contact with Patient 01/11/12 2214      Chief Complaint  Patient presents with  . Fall    (Consider location/radiation/quality/duration/timing/severity/associated sxs/prior treatment) Patient is a 46 y.o. male presenting with shoulder injury. The history is provided by the patient. A language interpreter was used.  Shoulder Injury This is a new problem. The current episode started today. The problem occurs constantly. The problem has been unchanged. Associated symptoms include joint swelling and myalgias. The symptoms are aggravated by nothing. He has tried nothing for the symptoms. The treatment provided moderate relief.  Pt reports he slipped and fell hitting left shoulder.  Past Medical History  Diagnosis Date  . Hypertension   . Hyperlipidemia   . Allergic rhinitis   . Other malaise and fatigue 2011    Testosterone normal 2011    History reviewed. No pertinent past surgical history.  Family History  Problem Relation Age of Onset  . Hypertension Father   . Stroke Paternal Grandfather   . Heart disease Paternal Grandfather   . Hypertension Paternal Grandfather   . Diabetes Paternal Grandfather   . Kidney disease Paternal Grandfather     History  Substance Use Topics  . Smoking status: Never Smoker   . Smokeless tobacco: Former Systems developer    Types: Chew  . Alcohol Use: Yes     socially      Review of Systems  Musculoskeletal: Positive for myalgias and joint swelling.  All other systems reviewed and are negative.    Allergies  Hctz and Lisinopril  Home Medications   Current Outpatient Rx  Name Route Sig Dispense Refill  . ASPIRIN 81 MG PO TABS Oral Take 81 mg by mouth daily.    Marland Kitchen HYDROCHLOROTHIAZIDE 25 MG PO TABS Oral Take 1 tablet (25 mg total) by mouth daily. 30 tablet 6  . LOSARTAN POTASSIUM 50 MG PO TABS Oral Take 1 tablet (50 mg total) by mouth daily. 30 tablet 6  .  SIMVASTATIN 20 MG PO TABS Oral Take 1 tablet (20 mg total) by mouth at bedtime. 30 tablet 5    BP 165/103  Pulse 86  Temp 98.1 F (36.7 C)  Resp 18  SpO2 99%  Physical Exam  Nursing note and vitals reviewed. Constitutional: He is oriented to person, place, and time. He appears well-developed and well-nourished.  HENT:  Head: Normocephalic and atraumatic.  Cardiovascular: Normal rate.   Pulmonary/Chest: Effort normal.  Musculoskeletal: He exhibits tenderness.  Neurological: He is alert and oriented to person, place, and time. He has normal reflexes.  Skin: Skin is warm.  Psychiatric: He has a normal mood and affect.    ED Course  Procedures (including critical care time)  Labs Reviewed - No data to display Dg Shoulder Left  01/11/2012  *RADIOLOGY REPORT*  Clinical Data:   Left shoulder pain status post fall.  LEFT SHOULDER - 2+ VIEW  Comparison: None.  Findings: Humeral head remains seated within the glenoid.  Mild contour deformity along the humeral neck may reflect prior trauma. No fracture line or displacement is appreciated on today's exam. No acromioclavicular separation.  No upper rib fracture.  Left upper lung is clear.  IMPRESSION: Mild contour irregularity to the humeral neck may reflect prior trauma. No displaced fracture identified. If clinical concern for a fracture persists, recommend a repeat radiograph in 7-10 days to evaluate for interval change or callus formation.  Original Report Authenticated By: Suanne Marker, M.D.     No diagnosis found.    MDM  Sling,   Ice,  rx for hydrocodone.  Pt advised to see Dr. Barbaraann Barthel for recheck in 1 week for repeat [place], [place] 01/11/12 2254

## 2012-01-11 NOTE — Telephone Encounter (Signed)
Recent cholestereol MUCH improved and at goal: continute zocor 46m qd, repeat labs in 1 yr. Liver enzymes mildly elevated--looking back in records this has been chronic and the recent ones are no different and very mildly up.  Reassure him that this is usually due to fatty liver, not typically a problem as long as we try to control cholesterol, weight, increase exercise.  Next o/v I'll screen for chronic viral hepatitis (hep B and C) b/c this is routine, and we can set up an ultrasound of his liver at any time to confirm dx of fatty liver.  Overall, would reassure and tell him this is something that nothing needs to be done about at the present time.  thx

## 2012-01-11 NOTE — Telephone Encounter (Signed)
I have attempted to contact this patient by phone with the following results: left message to return my call on answering machine (home).

## 2012-01-11 NOTE — Telephone Encounter (Signed)
Pt notified.  Questions answered.  Refill on Zocor sent to pharmacy.  Pt reassured.  Advised Hepatitis DID NOT show up in his labs as we have not done any hep labs.  Pt will follow up in six months and have labs at that time.

## 2012-01-11 NOTE — ED Provider Notes (Signed)
Medical screening examination/treatment/procedure(s) were performed by non-physician practitioner and as supervising physician I was immediately available for consultation/collaboration.   Blair Heys, MD 01/11/12 [phone]

## 2012-01-11 NOTE — Discharge Instructions (Signed)
Shoulder Pain The shoulder is a ball and socket joint. The muscles and tendons (rotator cuff) are what keep the shoulder in its joint and stable. This collection of muscles and tendons holds in the head (ball) of the humerus (upper arm bone) in the fossa (cup) of the scapula (shoulder blade). Today no reason was found for your shoulder pain. Often pain in the shoulder may be treated conservatively with temporary immobilization. For example, holding the shoulder in one place using a sling for rest. Physical therapy may be needed if problems continue. HOME CARE INSTRUCTIONS   Apply ice to the sore area for 15 to 20 minutes, 3 to 4 times per day for the first 2 days. Put the ice in a plastic bag. Place a towel between the bag of ice and your skin.   If you have or were given a shoulder sling and straps, do not remove for as long as directed by your caregiver or until you see a caregiver for a follow-up examination. If you need to remove it to shower or bathe, move your arm as little as possible.   Sleep on several pillows at night to lessen swelling and pain.   Only take over-the-counter or prescription medicines for pain, discomfort, or fever as directed by your caregiver.   Keep any follow-up appointments in order to avoid any type of permanent shoulder disability or chronic pain problems.  SEEK MEDICAL CARE IF:   Pain in your shoulder increases or new pain develops in your arm, hand, or fingers.   Your hand or fingers are colder than your other hand.   You do not obtain pain relief with the medications or your pain becomes worse.  SEEK IMMEDIATE MEDICAL CARE IF:   Your arm, hand, or fingers are numb or tingling.   Your arm, hand, or fingers are swollen, painful, or turn white or blue.   You develop chest pain or shortness of breath.  MAKE SURE YOU:   Understand these instructions.   Will watch your condition.   Will get help right away if you are not doing well or get worse.    Document Released: 06/10/2005 Document Revised: 08/20/2011 Document Reviewed: 08/15/2011 [employer] Patient Information 2012 [employer]. 

## 2012-01-11 NOTE — ED Notes (Signed)
Pt says that he was pulling hoses and tripped over the hose and fell to the ground landing on left shoulder. C/O left shoulder pain that radiates down left arm and upper through collar bone

## 2012-01-11 NOTE — ED Notes (Signed)
Chief Vernon Mccormick request urine drug screen only no BAT

## 2012-06-30 ENCOUNTER — Ambulatory Visit (INDEPENDENT_AMBULATORY_CARE_PROVIDER_SITE_OTHER): Payer: BC Managed Care – PPO | Admitting: Family Medicine

## 2012-06-30 ENCOUNTER — Encounter: Payer: Self-pay | Admitting: Family Medicine

## 2012-06-30 DIAGNOSIS — E785 Hyperlipidemia, unspecified: Secondary | ICD-10-CM

## 2012-06-30 DIAGNOSIS — E8881 Metabolic syndrome: Secondary | ICD-10-CM

## 2012-06-30 DIAGNOSIS — I10 Essential (primary) hypertension: Secondary | ICD-10-CM

## 2012-06-30 NOTE — Progress Notes (Signed)
Patient no showed

## 2012-06-30 NOTE — Progress Notes (Deleted)
OFFICE NOTE  06/30/2012  CC: No chief complaint on file.    HPI: Patient is a 46 y.o. Caucasian male who is here for 62mof/u HTN, hyperlipidemia, metabolic syndrome. He has history of mildly elevated transaminases over the last few years that apparently has not been worked up, presumably b/c of the assumption that they are up as a reflection of fatty liver.  However, I plan to screen for Hep C and Hep B and will follow AST/ALT closely since he is on a statin.    Pertinent PMH:  Past Medical History  Diagnosis Date  . Hypertension   . Hyperlipidemia   . Allergic rhinitis   . Other malaise and fatigue 2011    Testosterone normal 2011  . Elevated transaminase level 2009-2013  . Metabolic syndrome     MEDS:  Outpatient Prescriptions Prior to Visit  Medication Sig Dispense Refill  . aspirin 81 MG tablet Take 81 mg by mouth daily.      . hydrochlorothiazide (HYDRODIURIL) 25 MG tablet Take 1 tablet (25 mg total) by mouth daily.  30 tablet  6  . losartan (COZAAR) 50 MG tablet Take 1 tablet (50 mg total) by mouth daily.  30 tablet  6  . simvastatin (ZOCOR) 20 MG tablet Take 1 tablet (20 mg total) by mouth at bedtime.  30 tablet  5    PE: There were no vitals taken for this visit. ***  IMPRESSION AND PLAN: ***  FOLLOW UP: ***

## 2012-09-27 ENCOUNTER — Other Ambulatory Visit: Payer: Self-pay | Admitting: *Deleted

## 2012-09-27 DIAGNOSIS — I10 Essential (primary) hypertension: Secondary | ICD-10-CM

## 2012-09-27 DIAGNOSIS — R3129 Other microscopic hematuria: Secondary | ICD-10-CM

## 2012-09-27 DIAGNOSIS — R609 Edema, unspecified: Secondary | ICD-10-CM

## 2012-09-27 DIAGNOSIS — E785 Hyperlipidemia, unspecified: Secondary | ICD-10-CM

## 2012-09-27 MED ORDER — LOSARTAN POTASSIUM 50 MG PO TABS: 50.0000 mg | ORAL_TABLET | Freq: Every day | ORAL | Status: DC

## 2012-09-27 MED ORDER — HYDROCHLOROTHIAZIDE 25 MG PO TABS: 25.0000 mg | ORAL_TABLET | Freq: Every day | ORAL | Status: DC

## 2012-09-27 NOTE — Telephone Encounter (Signed)
Faxed refill request received from pharmacy for HCTZ, LOSARTAN Last filled by MD on 12/29/11, 30 X 6 Last seen on 12/29/11 Follow up 6 MONTHS, No Show to that appt. Refill x 30 days sent to pharmacy with note that appt needed for refills.

## 2012-11-02 ENCOUNTER — Other Ambulatory Visit: Payer: Self-pay | Admitting: *Deleted

## 2012-11-02 DIAGNOSIS — R609 Edema, unspecified: Secondary | ICD-10-CM

## 2012-11-02 DIAGNOSIS — R3129 Other microscopic hematuria: Secondary | ICD-10-CM

## 2012-11-02 DIAGNOSIS — I10 Essential (primary) hypertension: Secondary | ICD-10-CM

## 2012-11-02 NOTE — Telephone Encounter (Signed)
Faxed refill request received from pharmacy for HCTZ Last filled by MD on 09/27/12, #30 X 0-WITH NOTE NEEDS APPT  Faxed refill request received from pharmacy for [place] filled by MD on 09/27/12  Last seen on 12/29/11 Follow up 6 MONTHS, no-showed appt  Message left on 2/18 and 2/19 to return call.

## 2012-11-15 ENCOUNTER — Encounter: Payer: Self-pay | Admitting: Family Medicine

## 2012-11-15 ENCOUNTER — Ambulatory Visit (INDEPENDENT_AMBULATORY_CARE_PROVIDER_SITE_OTHER): Payer: Managed Care, Other (non HMO) | Admitting: Family Medicine

## 2012-11-15 VITALS — BP 140/78 | HR 63 | Ht 71.0 in | Wt 257.0 lb

## 2012-11-15 DIAGNOSIS — I10 Essential (primary) hypertension: Secondary | ICD-10-CM

## 2012-11-15 DIAGNOSIS — R7402 Elevation of levels of lactic acid dehydrogenase (LDH): Secondary | ICD-10-CM

## 2012-11-15 DIAGNOSIS — H1132 Conjunctival hemorrhage, left eye: Secondary | ICD-10-CM

## 2012-11-15 DIAGNOSIS — H1130 Conjunctival hemorrhage, unspecified eye: Secondary | ICD-10-CM

## 2012-11-15 DIAGNOSIS — E785 Hyperlipidemia, unspecified: Secondary | ICD-10-CM

## 2012-11-15 DIAGNOSIS — R3129 Other microscopic hematuria: Secondary | ICD-10-CM

## 2012-11-15 MED ORDER — HYDROCHLOROTHIAZIDE 25 MG PO TABS: 25.0000 mg | ORAL_TABLET | Freq: Every day | ORAL | Status: DC

## 2012-11-15 NOTE — Assessment & Plan Note (Signed)
Will repeat these ASAP with his fasting labs. Will also check anti Hep C antibody and Hep B surface antigen.

## 2012-11-15 NOTE — Assessment & Plan Note (Addendum)
Control has been adequate on meds but with borderline stage 1 systolic HTN. I feel like he could tolerate getting back on his HCTZ 70m qd.  Will d/c his losartan for now. Check lytes/cr when he comes in soon for fasting labs.

## 2012-11-15 NOTE — Assessment & Plan Note (Signed)
We did not get to address this today, but will have him do a UA when he comes in soon for his fasting labs. If still with microscopic hematuria then will discuss further w/u.

## 2012-11-15 NOTE — Patient Instructions (Signed)
Monitor blood pressure several times per week: goal bp is < 140/90.

## 2012-11-15 NOTE — Assessment & Plan Note (Signed)
Due for FLP. He'll return at his earliest convenience for fasting labs.

## 2012-11-15 NOTE — Telephone Encounter (Signed)
Pt presented for office visit today.  Refills given at that time.

## 2012-11-15 NOTE — Progress Notes (Signed)
OFFICE NOTE  11/15/2012  CC:  Chief Complaint  Patient presents with  . Follow-up    HTN, meds     HPI: Patient is a 47 y.o. Caucasian male who is here for 10 month f/u HTN, hyperlipidemia. He no showed for last appt 06/2012.  Has been out of bp meds for 1 mo but has been on his simvastatin still. About a month ago he began changing his diet--"cruise control diet".  Has been losing 1-3 lbs per week since. He feels like it is going to be sustainable. His bp's have been around 140s syst and 98P diastolic while off meds.  ROS: no abd pain, no anorexia, no n/v/d/constipation.  No CP, no HA's, no dizziness, no orthopnea, no PND, no DOE, no LE swelling.  No palpitations.   Left eye with red splotch on eyeball when he awoke this morning.  No eye pain, no vision complaint.  Pt cannot recall any recent eye trauma, heavy straining/lifting, or excessive cough.  Pertinent PMH:  Past Medical History  Diagnosis Date  . Hypertension   . Hyperlipidemia   . Allergic rhinitis   . Other malaise and fatigue 2011    Testosterone normal 2011  . Elevated transaminase level 2009-2013  . Metabolic syndrome   . ALLERGIC RHINITIS 05/12/2007    Qualifier: Diagnosis of  By: Scherrie Gerlach    . Recurrent sinusitis 11/16/2011    MEDS:  Outpatient Prescriptions Prior to Visit  Medication Sig Dispense Refill  . simvastatin (ZOCOR) 20 MG tablet Take 1 tablet (20 mg total) by mouth at bedtime.  30 tablet  5  . aspirin 81 MG tablet Take 81 mg by mouth daily.      . hydrochlorothiazide (HYDRODIURIL) 25 MG tablet Take 1 tablet (25 mg total) by mouth daily. MUST HAVE APPT FOR MORE REFILLS.  30 tablet  0  . losartan (COZAAR) 50 MG tablet Take 1 tablet (50 mg total) by mouth daily. MUST HAVE APPT FOR MORE REFILLS  30 tablet  0   No facility-administered medications prior to visit.    PE: Blood pressure 140/78, pulse 63, height 5' 11"  (1.803 m), weight 257 lb (116.574 kg). Gen: Alert, well appearing.  Patient is  oriented to person, place, time, and situation. ENT: Ears: EACs clear, normal epithelium.  TMs with good light reflex and landmarks bilaterally.  Eyes:  There is a splotch of erythema in left eye bulbar conjunctiva lateral to the cornea/iris c/w conjunctival hemorrhage. No icteris, swelling, or exudate.  EOMI, PERRLA. Nose: no drainage or turbinate edema/swelling.  No injection or focal lesion.  Mouth: lips without lesion/swelling.  Oral mucosa pink and moist.  Dentition intact and without obvious caries or gingival swelling.  Oropharynx without erythema, exudate, or swelling.  Neck - No masses or thyromegaly or limitation in range of motion CV: RRR, no m/r/g.   LUNGS: CTA bilat, nonlabored resps, good aeration in all lung fields. EXT: no clubbing, cyanosis, or edema.   LAB: none today  IMPRESSION AND PLAN:  HTN (hypertension), benign Control has been adequate on meds but with borderline stage 1 systolic HTN. I feel like he could tolerate getting back on his HCTZ 49m qd.  Will d/c his losartan for now. Check lytes/cr when he comes in soon for fasting labs.  HYPERLIPIDEMIA Due for FLP. He'll return at his earliest convenience for fasting labs.  Elevated transaminase level Will repeat these ASAP with his fasting labs. Will also check anti Hep C antibody and Hep  B surface antigen.  Microscopic hematuria We did not get to address this today, but will have him do a UA when he comes in soon for his fasting labs. If still with microscopic hematuria then will discuss further w/u.  Conjunctival hemorrhage of left eye Reassured pt that this is likely benign---superficial vein rupture from an unrecalled episode of straining or cough. Signs/symptoms to call or return for were reviewed and pt expressed understanding.    An After Visit Summary was printed and given to the patient.  FOLLOW UP: 45mofor CPE

## 2012-11-15 NOTE — Assessment & Plan Note (Signed)
Reassured pt that this is likely benign---superficial vein rupture from an unrecalled episode of straining or cough. Signs/symptoms to call or return for were reviewed and pt expressed understanding.

## 2012-11-17 ENCOUNTER — Other Ambulatory Visit (INDEPENDENT_AMBULATORY_CARE_PROVIDER_SITE_OTHER): Payer: Managed Care, Other (non HMO)

## 2012-11-17 DIAGNOSIS — R3129 Other microscopic hematuria: Secondary | ICD-10-CM

## 2012-11-17 DIAGNOSIS — I10 Essential (primary) hypertension: Secondary | ICD-10-CM

## 2012-11-17 DIAGNOSIS — E785 Hyperlipidemia, unspecified: Secondary | ICD-10-CM

## 2012-11-17 LAB — COMPREHENSIVE METABOLIC PANEL
Albumin: 4.4 g/dL (ref 3.5–5.2)
CO2: 26 mEq/L (ref 19–32)
Calcium: 9.1 mg/dL (ref 8.4–10.5)
Chloride: 99 mEq/L (ref 96–112)
GFR: 75.61 mL/min (ref >60.00)
Glucose, Bld: 86 mg/dL (ref 70–99)
Potassium: 3.5 mEq/L (ref 3.5–5.1)
Sodium: 135 mEq/L (ref 135–145)
Total Protein: 7.1 g/dL (ref 6.0–8.3)

## 2012-11-17 LAB — URINALYSIS, ROUTINE W REFLEX MICROSCOPIC
Ketones, ur: NEGATIVE
Urine Glucose: NEGATIVE
Urobilinogen, UA: 0.2 (ref 0.0–1.0)

## 2012-11-17 LAB — CBC WITH DIFFERENTIAL/PLATELET
Basophils Relative: 0.5 % (ref 0.0–3.0)
Eosinophils Relative: 1.5 % (ref 0.0–5.0)
HCT: 49.5 % (ref 39.0–52.0)
MCV: 86.1 fl (ref 78.0–100.0)
Monocytes Absolute: 0.6 10*3/uL (ref 0.1–1.0)
Monocytes Relative: 7.5 % (ref 3.0–12.0)
Neutrophils Relative %: 62.1 % (ref 43.0–77.0)
RBC: 5.75 Mil/uL (ref 4.22–5.81)
WBC: 7.6 10*3/uL (ref 4.5–10.5)

## 2012-11-17 LAB — HEPATITIS C ANTIBODY: HCV Ab: NEGATIVE

## 2012-11-17 LAB — LIPID PANEL: Triglycerides: 139 mg/dL (ref 0.0–149.0)

## 2012-11-17 NOTE — Progress Notes (Signed)
Labs only

## 2012-12-29 ENCOUNTER — Other Ambulatory Visit: Payer: Self-pay | Admitting: *Deleted

## 2012-12-29 MED ORDER — SIMVASTATIN 20 MG PO TABS: 20.0000 mg | ORAL_TABLET | Freq: Every day | ORAL | Status: DC

## 2012-12-29 NOTE — Telephone Encounter (Signed)
Faxed refill request received from pharmacy for SIMVASTATIN Last filled by MD on 01/11/12, #30 X 5 Last seen on 11/15/12 Follow up 4 MONTHS RX SENT.

## 2013-02-08 ENCOUNTER — Encounter: Payer: Self-pay | Admitting: Family Medicine

## 2013-02-08 ENCOUNTER — Ambulatory Visit (INDEPENDENT_AMBULATORY_CARE_PROVIDER_SITE_OTHER): Payer: Managed Care, Other (non HMO) | Admitting: Family Medicine

## 2013-02-08 VITALS — BP 145/101 | HR 74 | Temp 98.2°F | Resp 16 | Wt 246.2 lb

## 2013-02-08 DIAGNOSIS — I10 Essential (primary) hypertension: Secondary | ICD-10-CM

## 2013-02-08 DIAGNOSIS — E785 Hyperlipidemia, unspecified: Secondary | ICD-10-CM

## 2013-02-08 DIAGNOSIS — J019 Acute sinusitis, unspecified: Secondary | ICD-10-CM | POA: Insufficient documentation

## 2013-02-08 MED ORDER — CEPHALEXIN 500 MG PO CAPS: ORAL_CAPSULE | ORAL | Status: DC

## 2013-02-08 NOTE — Patient Instructions (Addendum)
Buy OTC nasal steroid spray called nasacort and take 2 sprays in each nostril once every morning until feeling well. Buy OTC nasal saline spray and use 2 sprays each nostril 2-3 times per day to irrigate and moisturize nasal passages.

## 2013-02-08 NOTE — Assessment & Plan Note (Signed)
Good control per home measurements reported today. Continue current med, diet, exercise.

## 2013-02-08 NOTE — Progress Notes (Signed)
OFFICE NOTE  02/08/2013  CC:  Chief Complaint  Patient presents with  . Sinusitis    Pt c/o sinus pain & pressure, mucus greenish-brown w/blood, post nasal drip, ears "popping, non-productive cough x3 wks.     HPI: Patient is a 47 y.o. Caucasian male who is here for respiratory complaints.   Pt presents complaining of respiratory symptoms for 2 + wks.  Primary symptoms are: nasal, facial, head congestion--lots of dark, sticky mucous.  L side> R.  +PND.  Some cough at night  Lately the symptoms seem to be worsening. Pertinent negatives: No fevers, no wheezing, and no SOB.  No pain in face or teeth.  No significant HA.  ST mild at most.   Symptoms made worse by morning.  Symptoms improved by shower/steam. Recent sick contact? unknown Muscle or joint aches? Mild fatigue  Additional ROS: no n/v/d or abdominal pain.  No rash.  No neck stiffness.   +Mild fatigue.   Of note, he has kept up his low-low carb diet since I last saw him 11/2012 and has kept active/exercising.  Home bp's 130s/80s to low 90s. He feels better than he has in a long time.   He is compliant with his cholesterol and bp meds.  Pertinent PMH:  Past Medical History  Diagnosis Date  . Hypertension   . Hyperlipidemia   . Allergic rhinitis   . Other malaise and fatigue 2011    Testosterone normal 2011  . Elevated transaminase level 2009-2013  . Metabolic syndrome   . ALLERGIC RHINITIS 05/12/2007    Qualifier: Diagnosis of  By: Scherrie Gerlach    . Recurrent sinusitis 11/16/2011   Past surgical, social, and family history reviewed and no changes noted since last office visit.  MEDS:  Outpatient Prescriptions Prior to Visit  Medication Sig Dispense Refill  . aspirin 81 MG tablet Take 81 mg by mouth daily.      . hydrochlorothiazide (HYDRODIURIL) 25 MG tablet Take 1 tablet (25 mg total) by mouth daily.  30 tablet  6  . simvastatin (ZOCOR) 20 MG tablet Take 1 tablet (20 mg total) by mouth at bedtime.  30 tablet  5    No facility-administered medications prior to visit.    PE: Blood pressure 145/101, pulse 74, temperature 98.2 F (36.8 C), temperature source Oral, resp. rate 16, weight 246 lb 4 oz (111.698 kg), SpO2 98.00%. VS: noted--normal. Gen: alert, NAD, NONTOXIC APPEARING. HEENT: eyes without injection, drainage, or swelling.  Ears: EACs clear, TMs with normal light reflex and landmarks.  Nose: Clear rhinorrhea, with some dried, crusty exudate adherent to mildly injected mucosa.  No purulent d/c visible.  Diffuse paranasal sinus TTP.  No facial swelling.  Throat and mouth without focal lesion.  No pharyngial swelling, erythema, or exudate.   Neck: supple, no LAD.   LUNGS: CTA bilat, nonlabored resps.   CV: RRR, no m/r/g. EXT: no c/c/e SKIN: no rash    IMPRESSION AND PLAN:  Sinusitis, acute Cephalexin 570m bid x 10d. Buy OTC nasal steroid spray called nasacort and take 2 sprays in each nostril once every morning until feeling well. Buy OTC nasal saline spray and use 2 sprays each nostril 2-3 times per day to irrigate and moisturize nasal passages.   HTN (hypertension), benign Good control per home measurements reported today. Continue current med, diet, exercise.  HYPERLIPIDEMIA Last labs 11/2012 were ok, esp in light of his starting a new diet and exercise regimen, which he has seemed to keep  up well in the last couple of months. We'll put off his upcoming routine f/u visit until mid August this year and check in again and do fasting labs at that time. Continue current med, diet, and exercise for now.   An After Visit Summary was printed and given to the patient.  FOLLOW UP: 3mo

## 2013-02-08 NOTE — Assessment & Plan Note (Signed)
Last labs 11/2012 were ok, esp in light of his starting a new diet and exercise regimen, which he has seemed to keep up well in the last couple of months. We'll put off his upcoming routine f/u visit until mid August this year and check in again and do fasting labs at that time. Continue current med, diet, and exercise for now.

## 2013-02-08 NOTE — Assessment & Plan Note (Signed)
Cephalexin 563m bid x 10d. Buy OTC nasal steroid spray called nasacort and take 2 sprays in each nostril once every morning until feeling well. Buy OTC nasal saline spray and use 2 sprays each nostril 2-3 times per day to irrigate and moisturize nasal passages.

## 2013-02-13 ENCOUNTER — Encounter: Payer: Self-pay | Admitting: Family Medicine

## 2013-05-09 ENCOUNTER — Ambulatory Visit (INDEPENDENT_AMBULATORY_CARE_PROVIDER_SITE_OTHER): Payer: Managed Care, Other (non HMO) | Admitting: Family Medicine

## 2013-05-09 ENCOUNTER — Encounter: Payer: Self-pay | Admitting: Family Medicine

## 2013-05-09 ENCOUNTER — Telehealth: Payer: Self-pay | Admitting: Family Medicine

## 2013-05-09 VITALS — BP 162/103 | HR 64 | Temp 97.7°F | Resp 18 | Ht 71.0 in | Wt 252.0 lb

## 2013-05-09 DIAGNOSIS — E8881 Metabolic syndrome: Secondary | ICD-10-CM

## 2013-05-09 DIAGNOSIS — Z0000 Encounter for general adult medical examination without abnormal findings: Secondary | ICD-10-CM

## 2013-05-09 DIAGNOSIS — R3129 Other microscopic hematuria: Secondary | ICD-10-CM

## 2013-05-09 DIAGNOSIS — I10 Essential (primary) hypertension: Secondary | ICD-10-CM

## 2013-05-09 LAB — CBC WITH DIFFERENTIAL/PLATELET
Basophils Absolute: 0.1 10*3/uL (ref 0.0–0.1)
Eosinophils Absolute: 0.1 10*3/uL (ref 0.0–0.7)
Lymphocytes Relative: 36 % (ref 12.0–46.0)
Lymphs Abs: 2.9 10*3/uL (ref 0.7–4.0)
MCHC: 34.1 g/dL (ref 30.0–36.0)
Neutrophils Relative %: 55.2 % (ref 43.0–77.0)
Platelets: 242 10*3/uL (ref 150.0–400.0)
RDW: 14.7 % — ABNORMAL HIGH (ref 11.5–14.6)

## 2013-05-09 LAB — LIPID PANEL
Cholesterol: 216 mg/dL — ABNORMAL HIGH (ref 0–200)
HDL: 42.4 mg/dL (ref >39.00)
Triglycerides: 154 mg/dL — ABNORMAL HIGH (ref 0.0–149.0)
VLDL: 30.8 mg/dL (ref 0.0–40.0)

## 2013-05-09 LAB — COMPREHENSIVE METABOLIC PANEL
ALT: 50 U/L (ref 0–53)
AST: 30 U/L (ref 0–37)
Albumin: 4.5 g/dL (ref 3.5–5.2)
Alkaline Phosphatase: 65 U/L (ref 39–117)
Calcium: 9.4 mg/dL (ref 8.4–10.5)
Chloride: 100 mEq/L (ref 96–112)
Potassium: 3.3 mEq/L — ABNORMAL LOW (ref 3.5–5.1)

## 2013-05-09 LAB — TSH: TSH: 0.55 u[IU]/mL (ref 0.35–5.50)

## 2013-05-09 IMAGING — CR DG SHOULDER 2+V*L*
4 series · 4 of 4 positions shown · non-contrast
Comparison: None.

CLINICAL DATA: Left shoulder pain status post fall.

LEFT SHOULDER - 2+ VIEW

[w shoulder ap internal left]
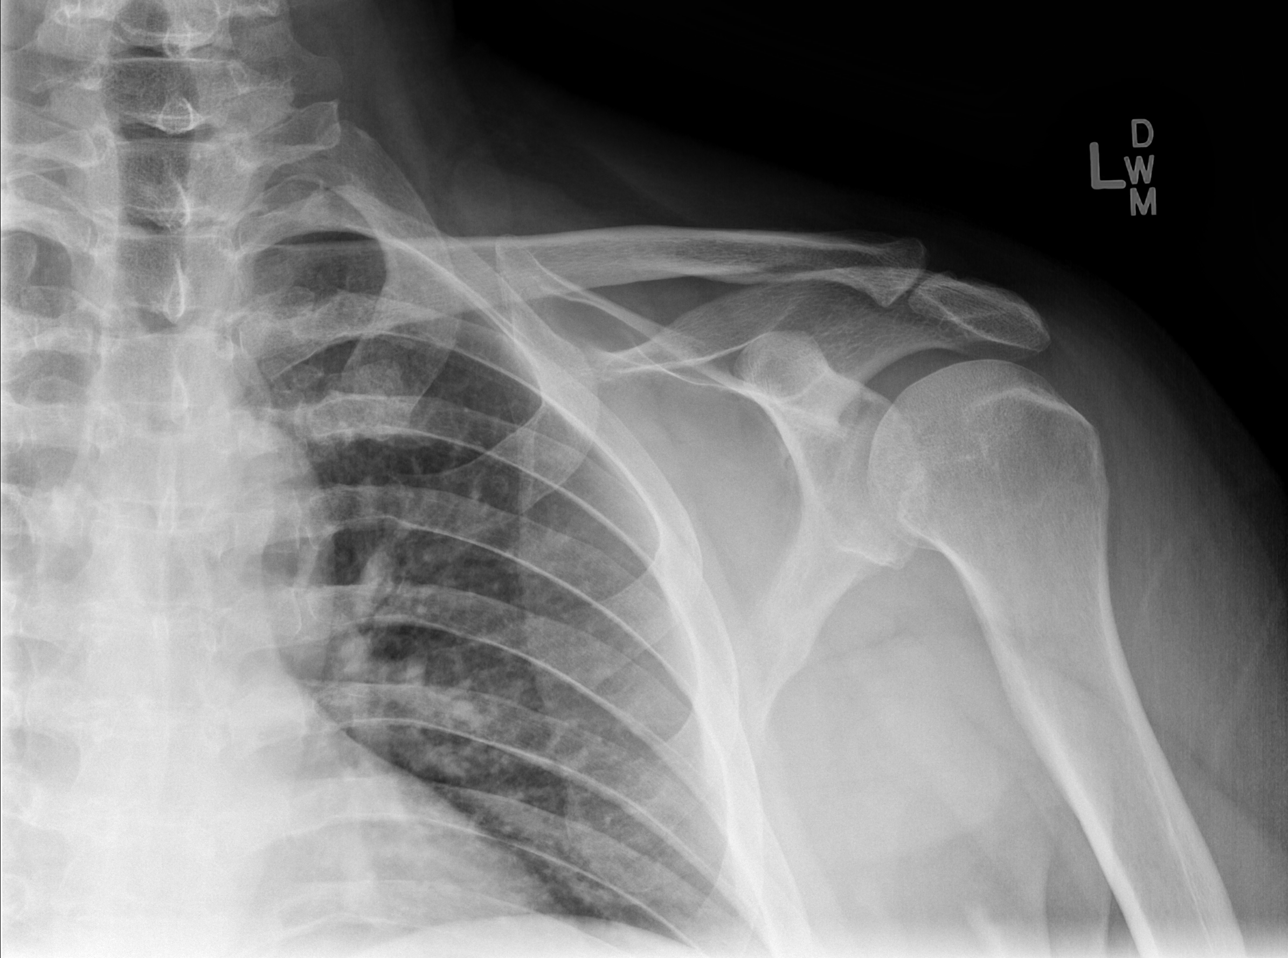

[w shoulder ap external left]
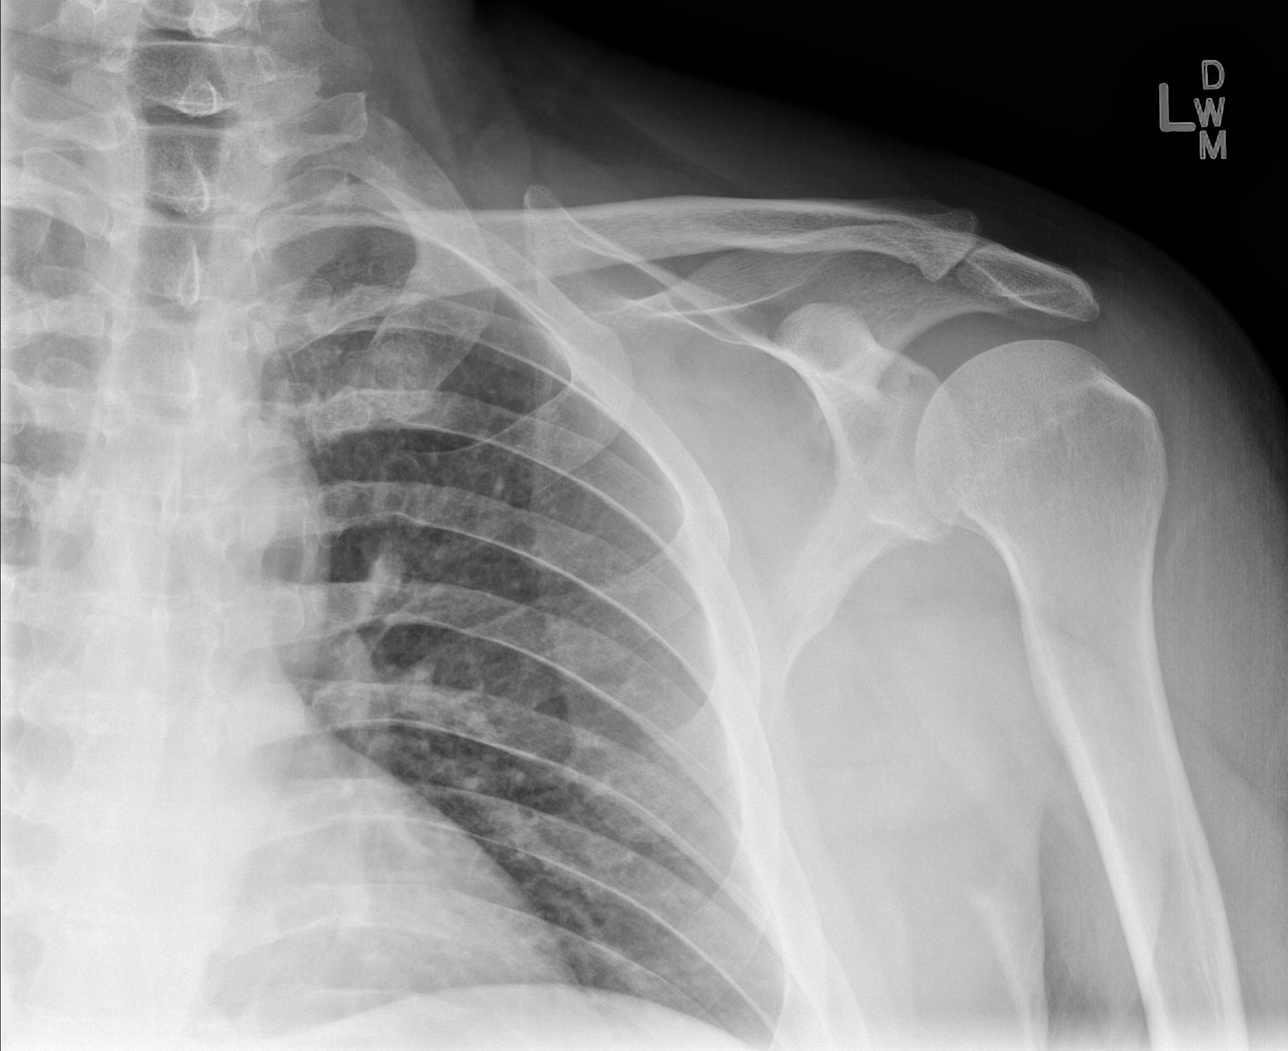

[w shoulder y view left (1 of 2)]
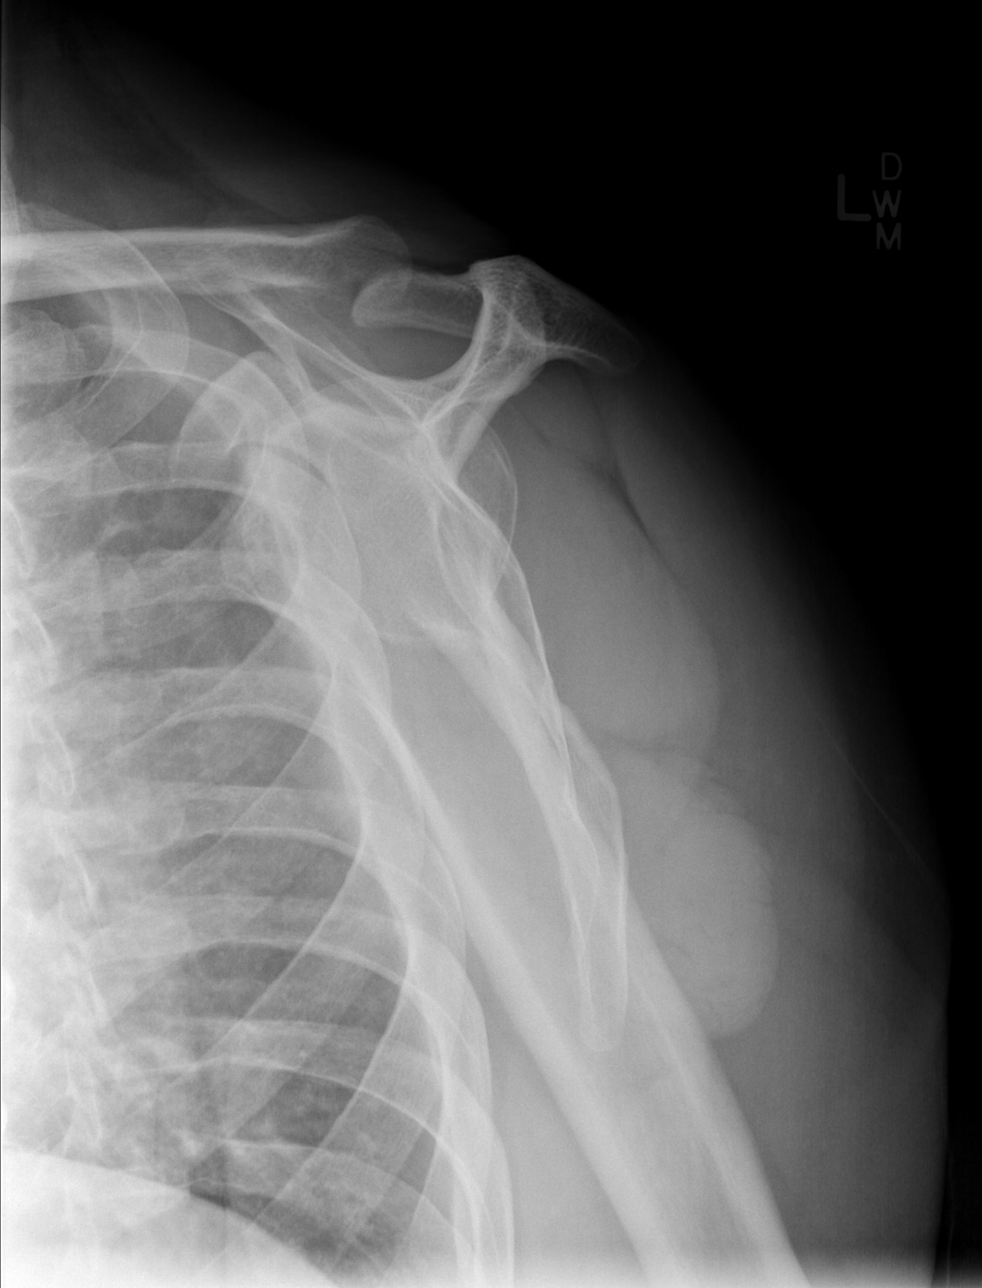

[w shoulder y view left (2 of 2)]
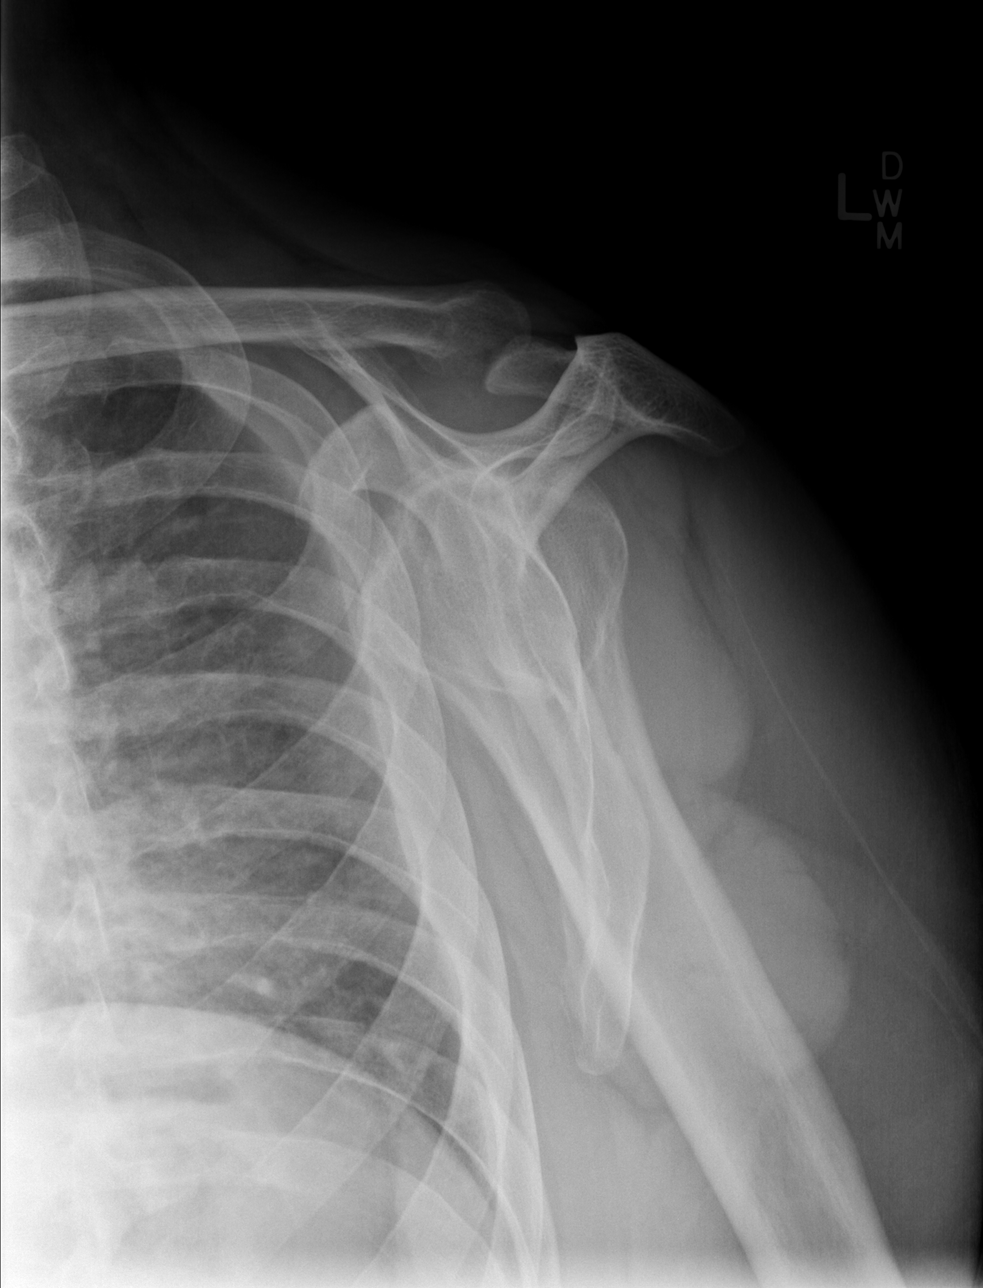

[4 of 4 positions shown; findings below may reference images not displayed]

FINDINGS: Humeral head remains seated within the glenoid.  Mild
contour deformity along the humeral neck may reflect prior trauma.
No fracture line or displacement is appreciated on today's exam.
No acromioclavicular separation.  No upper rib fracture.  Left
upper lung is clear.
IMPRESSION: Mild contour irregularity to the humeral neck may reflect prior
trauma. No displaced fracture identified. If clinical concern for a
fracture persists, recommend a repeat radiograph in 7-10 days to
evaluate for interval change or callus formation.

## 2013-05-09 MED ORDER — LISINOPRIL-HYDROCHLOROTHIAZIDE 20-25 MG PO TABS: 1.0000 | ORAL_TABLET | Freq: Every day | ORAL | Status: DC

## 2013-05-09 NOTE — Assessment & Plan Note (Signed)
Reviewed age and gender appropriate health maintenance issues (prudent diet, regular exercise, health risks of tobacco and excessive alcohol, use of seatbelts, fire alarms in home, use of sunscreen).  Also reviewed age and gender appropriate health screening as well as vaccine recommendations. No vaccines needed today.  Return for flu vaccine this fall. CBC, CMET, TSH, and FLP done today.

## 2013-05-09 NOTE — Assessment & Plan Note (Signed)
Recheck fasting lipids and gluc today. He's been off statin for at least 2 mo. Continue diet/exercise.

## 2013-05-09 NOTE — Telephone Encounter (Signed)
Pls call pt and tell him that I forgot to have him give a urine sample to recheck for microscopic blood.  He has had microscopic blood on past urine sample and I wanted to recheck this but forgot and then he left the office. Ask him to return at his convenience just to give a urine sample to recheck the and make sure it is nothing that needs to be looked into further.  Order is in. -thx

## 2013-05-09 NOTE — Progress Notes (Signed)
Office Note 05/09/2013  CC:  Chief Complaint  Patient presents with  . Annual Exam  . Hypertension    HPI:  Vernon Mccormick is a 47 y.o. White male who is here for CPE/health maintenance exam and f/u metabolic syndrome.   He feels good. 140s over 80s-90s.  Rarely 010U systolic. Diet is pretty good: low carb, watching fats/chol more, trying to watch sodium. He has not taken his cholest med in over 2 mo--"b/c last labs in March were pretty good".   He is fasting for labs today.   Past Medical History  Diagnosis Date  . Hypertension   . Hyperlipidemia   . Allergic rhinitis   . Other malaise and fatigue 2011    Testosterone normal 2011  . Elevated transaminase level 2009-2013  . Metabolic syndrome   . ALLERGIC RHINITIS 05/12/2007    Qualifier: Diagnosis of  By: Scherrie Gerlach    . Recurrent sinusitis 11/16/2011    Past Surgical History  Procedure Laterality Date  . Shoulder surgery  2013    Left rotator cuff and biceps tendon repair (s/p fall)    Family History  Problem Relation Age of Onset  . Hypertension Father   . Stroke Paternal Grandfather   . Heart disease Paternal Grandfather   . Hypertension Paternal Grandfather   . Diabetes Paternal Grandfather   . Kidney disease Paternal Grandfather     History   Social History  . Marital Status: Married    Spouse Name: N/A    Number of Children: N/A  . Years of Education: N/A   Occupational History  . Not on file.   Social History Main Topics  . Smoking status: Never Smoker   . Smokeless tobacco: Former Systems developer    Types: Chew  . Alcohol Use: Yes     Comment: socially  . Drug Use: No  . Sexual Activity: Not on file   Other Topics Concern  . Not on file   Social History Narrative   Married, 2 kids.   Long time local resident, grad of [employer].     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   No Tob.  Rare/social ETOH.  No drugs.    Outpatient Prescriptions Prior to Visit  Medication  Sig Dispense Refill  . aspirin 81 MG tablet Take 81 mg by mouth daily.      . cetirizine (ZYRTEC) 10 MG tablet Take 10 mg by mouth daily.      . simvastatin (ZOCOR) 20 MG tablet Take 1 tablet (20 mg total) by mouth at bedtime.  30 tablet  5  . hydrochlorothiazide (HYDRODIURIL) 25 MG tablet Take 1 tablet (25 mg total) by mouth daily.  30 tablet  6  . cephALEXin (KEFLEX) 500 MG capsule 1 tab po bid x 10d  20 capsule  0   No facility-administered medications prior to visit.    No Active Allergies  ROS Review of Systems  Constitutional: Negative for fever, chills, appetite change and fatigue.  HENT: Negative for ear pain, congestion, sore throat, neck stiffness and dental problem.   Eyes: Negative for discharge, redness and visual disturbance.  Respiratory: Negative for cough, chest tightness, shortness of breath and wheezing.   Cardiovascular: Negative for chest pain, palpitations and leg swelling.  Gastrointestinal: Negative for nausea, vomiting, abdominal pain, diarrhea and blood in stool.  Genitourinary: Negative for dysuria, urgency, frequency, hematuria, flank pain and difficulty urinating.  Musculoskeletal: Positive for back pain (intermittent mild/mod LBP with some "  discomfort radiating into both legs" at times.  Nothing currently.). Negative for myalgias, joint swelling and arthralgias.  Skin: Negative for pallor and rash.  Neurological: Negative for dizziness, speech difficulty, weakness and headaches.  Hematological: Negative for adenopathy. Does not bruise/bleed easily.  Psychiatric/Behavioral: Negative for confusion and sleep disturbance. The patient is not nervous/anxious.      PE; Blood pressure 162/103, pulse 64, temperature 97.7 F (36.5 C), temperature source Temporal, resp. rate 18, height 5' 11"  (1.803 m), weight 252 lb (114.306 kg), SpO2 99.00%. Gen: Alert, well appearing.  Patient is oriented to person, place, time, and situation. AFFECT: pleasant, lucid thought and  speech. ENT: Ears: EACs clear, normal epithelium.  TMs with good light reflex and landmarks bilaterally.  Eyes: no injection, icteris, swelling, or exudate.  EOMI, PERRLA. Nose: no drainage or turbinate edema/swelling.  No injection or focal lesion.  Mouth: lips without lesion/swelling.  Oral mucosa pink and moist.  Dentition intact and without obvious caries or gingival swelling.  Oropharynx without erythema, exudate, or swelling.  Neck: supple/nontender.  No LAD, mass, or TM.  Carotid pulses 2+ bilaterally, without bruits. CV: RRR, no m/r/g.   LUNGS: CTA bilat, nonlabored resps, good aeration in all lung fields. ABD: soft, NT, ND, BS normal.  No hepatospenomegaly or mass.  No bruits. EXT: no clubbing, cyanosis, or edema.  Musculoskeletal: no joint swelling, erythema, warmth, or tenderness.  ROM of all joints intact. Skin - no sores or suspicious lesions or rashes or color changes Neuro: CN 2-12 intact bilaterally, strength 5/5 in proximal and distal upper extremities and lower extremities bilaterally.  No sensory deficits.  No tremor.  No disdiadochokinesis.  No ataxia.  Upper extremity and lower extremity DTRs symmetric.  No pronator drift. GU and rectal: deferred.  Pertinent labs:  None today  ASSESSMENT AND PLAN:   HTN (hypertension), benign Not at goal bp control range. After discussion today we came to the conclusion that he does NOT have an allergy or intolerance to lisinopril (it was stopped at the same time as amlodipine due to LE swelling, and we switched to HCTZ at that time.  The amlodipine was new to him at that time and he had tolerated the lisinopril for a long time prior).  Therefore we'll restart lisin/HCTZ combo 20/25, 1 qd, #30, RF x 6. Continue low Na diet, gradually increase exercise.  Metabolic syndrome Recheck fasting lipids and gluc today. He's been off statin for at least 2 mo. Continue diet/exercise.  Health maintenance examination Reviewed age and gender  appropriate health maintenance issues (prudent diet, regular exercise, health risks of tobacco and excessive alcohol, use of seatbelts, fire alarms in home, use of sunscreen).  Also reviewed age and gender appropriate health screening as well as vaccine recommendations. No vaccines needed today.  Return for flu vaccine this fall. CBC, CMET, TSH, and FLP done today.  Microscopic hematuria Forgot to get him to give urine sample today. Will call pt and ask him to drop by at his convenience to leave sample for UA with microscopy.   An After Visit Summary was printed and given to the patient.  FOLLOW UP:  Return in about 6 months (around 03/22/2956) for f/u metabolic synd/htn.

## 2013-05-09 NOTE — Assessment & Plan Note (Signed)
Not at goal bp control range. After discussion today we came to the conclusion that he does NOT have an allergy or intolerance to lisinopril (it was stopped at the same time as amlodipine due to LE swelling, and we switched to HCTZ at that time.  The amlodipine was new to him at that time and he had tolerated the lisinopril for a long time prior).  Therefore we'll restart lisin/HCTZ combo 20/25, 1 qd, #30, RF x 6. Continue low Na diet, gradually increase exercise.

## 2013-05-09 NOTE — Assessment & Plan Note (Signed)
Forgot to get him to give urine sample today. Will call pt and ask him to drop by at his convenience to leave sample for UA with microscopy.

## 2013-11-08 ENCOUNTER — Ambulatory Visit (INDEPENDENT_AMBULATORY_CARE_PROVIDER_SITE_OTHER): Payer: Managed Care, Other (non HMO) | Admitting: Family Medicine

## 2013-11-08 ENCOUNTER — Encounter: Payer: Self-pay | Admitting: Family Medicine

## 2013-11-08 VITALS — BP 142/89 | HR 64 | Temp 97.7°F | Resp 18 | Ht 71.0 in | Wt 263.0 lb

## 2013-11-08 DIAGNOSIS — I10 Essential (primary) hypertension: Secondary | ICD-10-CM

## 2013-11-08 DIAGNOSIS — R3129 Other microscopic hematuria: Secondary | ICD-10-CM

## 2013-11-08 DIAGNOSIS — E8881 Metabolic syndrome: Secondary | ICD-10-CM

## 2013-11-08 DIAGNOSIS — E785 Hyperlipidemia, unspecified: Secondary | ICD-10-CM

## 2013-11-08 LAB — URINALYSIS, ROUTINE W REFLEX MICROSCOPIC
Bilirubin Urine: NEGATIVE
HGB URINE DIPSTICK: NEGATIVE
Ketones, ur: NEGATIVE
Leukocytes, UA: NEGATIVE
NITRITE: NEGATIVE
Specific Gravity, Urine: 1.01 (ref 1.000–1.030)
Total Protein, Urine: NEGATIVE
UROBILINOGEN UA: 0.2 (ref 0.0–1.0)
Urine Glucose: NEGATIVE
WBC UA: NONE SEEN — AB (ref 0–?)
pH: 7 (ref 5.0–8.0)

## 2013-11-08 LAB — COMPREHENSIVE METABOLIC PANEL
ALT: 65 U/L — AB (ref 0–53)
AST: 32 U/L (ref 0–37)
Albumin: 4.3 g/dL (ref 3.5–5.2)
Alkaline Phosphatase: 58 U/L (ref 39–117)
BUN: 18 mg/dL (ref 6–23)
CALCIUM: 9.3 mg/dL (ref 8.4–10.5)
CHLORIDE: 102 meq/L (ref 96–112)
CO2: 23 mEq/L (ref 19–32)
CREATININE: 0.9 mg/dL (ref 0.4–1.5)
GFR: 92.14 mL/min (ref >60.00)
Glucose, Bld: 84 mg/dL (ref 70–99)
Potassium: 3.6 mEq/L (ref 3.5–5.1)
SODIUM: 136 meq/L (ref 135–145)
TOTAL PROTEIN: 7.2 g/dL (ref 6.0–8.3)
Total Bilirubin: 0.8 mg/dL (ref 0.3–1.2)

## 2013-11-08 LAB — LIPID PANEL
Cholesterol: 231 mg/dL — ABNORMAL HIGH (ref 0–200)
HDL: 37.3 mg/dL — AB (ref >39.00)
Total CHOL/HDL Ratio: 6
Triglycerides: 165 mg/dL — ABNORMAL HIGH (ref 0.0–149.0)
VLDL: 33 mg/dL (ref 0.0–40.0)

## 2013-11-08 LAB — LDL CHOLESTEROL, DIRECT: LDL DIRECT: 172.1 mg/dL

## 2013-11-08 MED ORDER — FELODIPINE ER 5 MG PO TB24: 5.0000 mg | ORAL_TABLET | Freq: Every day | ORAL | Status: DC

## 2013-11-08 NOTE — Progress Notes (Signed)
OFFICE NOTE  11/08/2013  CC:  Chief Complaint  Patient presents with  . Follow-up     HPI: Patient is a 48 y.o. Caucasian male who is here for 40mof/u metabolic syndrome. Home bp consistently in low stage 1 HTN range.  He feels good. No exercise at all.  Low carb diet still.    Pertinent PMH:  Past medical, surgical, social, and family history reviewed and no changes are noted since last office visit.  MEDS:  Not taking cephalexin or zocor as listed below Outpatient Prescriptions Prior to Visit  Medication Sig Dispense Refill  . aspirin 81 MG tablet Take 81 mg by mouth daily.      . cetirizine (ZYRTEC) 10 MG tablet Take 10 mg by mouth daily.      .Marland Kitchenlisinopril-hydrochlorothiazide (PRINZIDE,ZESTORETIC) 20-25 MG per tablet Take 1 tablet by mouth daily.  30 tablet  6  . cyanocobalamin 100 MCG tablet Take 100 mcg by mouth daily.      . magnesium 30 MG tablet Take 30 mg by mouth 2 (two) times daily.      . simvastatin (ZOCOR) 20 MG tablet Take 1 tablet (20 mg total) by mouth at bedtime.  30 tablet  5  . cephALEXin (KEFLEX) 500 MG capsule 1 tab po bid x 10d  20 capsule  0   No facility-administered medications prior to visit.    PE: Blood pressure 142/89, pulse 64, temperature 97.7 F (36.5 C), temperature source Temporal, resp. rate 18, height 5' 11"  (1.803 m), weight 263 lb (119.296 kg), SpO2 99.00%. Gen: Alert, well appearing.  Patient is oriented to person, place, time, and situation. CV: RRR, no m/r/g.   LUNGS: CTA bilat, nonlabored resps, good aeration in all lung fields. EXT: no clubbing, cyanosis, or edema.   IMPRESSION AND PLAN:  1) HTN: not ideal control.  Cont zestoretic 20/25 qd and add felodipine 540mpo qd. BMET today.  2) Hyperlipidemia; diet fair.  Needs to EXERCISE. Off statin last 40m640moRecheck FLP today.  3) Hx of microscopic hematuria.  Need to confirm that this is still present. UA with reflex micro sent today.  4) Obesity: re-emphasized  diet/exercise.  FOLLOW UP: 3 mo

## 2013-11-08 NOTE — Progress Notes (Signed)
Pre visit review using our clinic review tool, if applicable. No additional management support is needed unless otherwise documented below in the visit note. 

## 2013-11-10 ENCOUNTER — Telehealth: Payer: Self-pay | Admitting: Family Medicine

## 2013-11-10 NOTE — Telephone Encounter (Signed)
Relevant patient education assigned to patient using Emmi. ° °

## 2013-11-14 MED ORDER — SIMVASTATIN 20 MG PO TABS: 20.0000 mg | ORAL_TABLET | Freq: Every day | ORAL | Status: DC

## 2013-11-14 NOTE — Addendum Note (Signed)
Addended by: Ralph Dowdy on: 11/14/2013 12:24 PM   Modules accepted: Orders

## 2013-12-14 ENCOUNTER — Other Ambulatory Visit: Payer: Self-pay

## 2013-12-14 MED ORDER — LISINOPRIL-HYDROCHLOROTHIAZIDE 20-25 MG PO TABS: 1.0000 | ORAL_TABLET | Freq: Every day | ORAL | Status: DC

## 2014-02-08 ENCOUNTER — Encounter: Payer: Self-pay | Admitting: Family Medicine

## 2014-02-08 ENCOUNTER — Ambulatory Visit (INDEPENDENT_AMBULATORY_CARE_PROVIDER_SITE_OTHER): Payer: Managed Care, Other (non HMO) | Admitting: Family Medicine

## 2014-02-08 VITALS — BP 117/80 | HR 72 | Temp 98.0°F | Resp 18 | Ht 71.0 in | Wt 248.0 lb

## 2014-02-08 DIAGNOSIS — E785 Hyperlipidemia, unspecified: Secondary | ICD-10-CM

## 2014-02-08 DIAGNOSIS — R7401 Elevation of levels of liver transaminase levels: Secondary | ICD-10-CM

## 2014-02-08 DIAGNOSIS — R21 Rash and other nonspecific skin eruption: Secondary | ICD-10-CM | POA: Insufficient documentation

## 2014-02-08 DIAGNOSIS — R740 Nonspecific elevation of levels of transaminase and lactic acid dehydrogenase [LDH]: Secondary | ICD-10-CM

## 2014-02-08 DIAGNOSIS — R7402 Elevation of levels of lactic acid dehydrogenase (LDH): Secondary | ICD-10-CM

## 2014-02-08 LAB — LIPID PANEL
CHOLESTEROL: 199 mg/dL (ref 0–200)
HDL: 41.3 mg/dL (ref >39.00)
LDL Cholesterol: 133 mg/dL — ABNORMAL HIGH (ref 0–99)
TRIGLYCERIDES: 124 mg/dL (ref 0.0–149.0)
Total CHOL/HDL Ratio: 5
VLDL: 24.8 mg/dL (ref 0.0–40.0)

## 2014-02-08 LAB — ALT: ALT: 41 U/L (ref 0–53)

## 2014-02-08 LAB — AST: AST: 28 U/L (ref 0–37)

## 2014-02-08 NOTE — Progress Notes (Signed)
OFFICE NOTE  02/08/2014  CC:  Chief Complaint  Patient presents with  . Follow-up    fasting      HPI: Patient is a 48 y.o. Caucasian male who is here for 3 mo f/u HTN, hyperlipidemia, metabolic syndrome. Last visit his lipids were elevated so we restarted zocor 55m qd.  He is fasting for recheck of lipids today.  After starting zocor again AND after starting felodipine after last visit he has noted on/off rash on both ankles, little red splotches that don't itch.  Goes away a day or two and comes back for a few days.  Has rash no where else.  He has felt well otherwise.  He seems to think he had similar reaction after starting zocor a couple of years ago.  No LE swelling.  Has been exercising more, trying to eat better.  Home bp checks twice a week 130s over 80s/90s.   Pertinent PMH:  Past medical, surgical, social, and family history reviewed and no changes are noted since last office visit.  MEDS:  Outpatient Prescriptions Prior to Visit  Medication Sig Dispense Refill  . aspirin 81 MG tablet Take 81 mg by mouth daily.      . cetirizine (ZYRTEC) 10 MG tablet Take 10 mg by mouth daily.      . felodipine (PLENDIL) 5 MG 24 hr tablet Take 1 tablet (5 mg total) by mouth daily.  30 tablet  6  . lisinopril-hydrochlorothiazide (PRINZIDE,ZESTORETIC) 20-25 MG per tablet Take 1 tablet by mouth daily.  30 tablet  3  . simvastatin (ZOCOR) 20 MG tablet Take 1 tablet (20 mg total) by mouth at bedtime.  30 tablet  4  . cyanocobalamin 100 MCG tablet Take 100 mcg by mouth daily.      . magnesium 30 MG tablet Take 30 mg by mouth 2 (two) times daily.       No facility-administered medications prior to visit.    PE: Blood pressure 117/80, pulse 72, temperature 98 F (36.7 C), temperature source Temporal, resp. rate 18, height 5' 11"  (1.803 m), weight 248 lb (112.492 kg), SpO2 98.00%. Gen: Alert, well appearing.  Patient is oriented to person, place, time, and situation. Ankles:  nonblanching, reddish macules that coalesce in some areas, bilat medial ankle regions--c/w petechial rash. Nontender.  No signif pitting edema.  IMPRESSION AND PLAN:  1) Hyperlipidemia: cont simvastatin, check FLP today. 2) Hx of mildly elevated transaminase: recheck AST/ALT, esp since restart of statin recently. 3) Rash, most likely medication related.  Suspect felodipine so will stop this and see how rash goes, continue to monitor home bp and to keep things from getting confusing will not start replacement med at this time.  An After Visit Summary was printed and given to the patient.   FOLLOW UP: 1 mo, recheck for rash/HTN

## 2014-02-08 NOTE — Progress Notes (Signed)
Pre visit review using our clinic review tool, if applicable. No additional management support is needed unless otherwise documented below in the visit note. 

## 2014-02-09 ENCOUNTER — Telehealth: Payer: Self-pay | Admitting: Family Medicine

## 2014-02-09 NOTE — Telephone Encounter (Signed)
Patient returned your call.

## 2014-02-12 NOTE — Telephone Encounter (Signed)
Spoke with pt about labs.

## 2014-03-08 ENCOUNTER — Encounter: Payer: Self-pay | Admitting: Family Medicine

## 2014-03-08 ENCOUNTER — Ambulatory Visit (INDEPENDENT_AMBULATORY_CARE_PROVIDER_SITE_OTHER): Payer: Managed Care, Other (non HMO) | Admitting: Family Medicine

## 2014-03-08 VITALS — BP 126/86 | HR 66 | Temp 97.9°F | Resp 18 | Ht 71.0 in | Wt 250.0 lb

## 2014-03-08 DIAGNOSIS — I10 Essential (primary) hypertension: Secondary | ICD-10-CM

## 2014-03-08 DIAGNOSIS — E785 Hyperlipidemia, unspecified: Secondary | ICD-10-CM

## 2014-03-08 DIAGNOSIS — R21 Rash and other nonspecific skin eruption: Secondary | ICD-10-CM

## 2014-03-08 NOTE — Progress Notes (Signed)
OFFICE NOTE  03/08/2014  CC:  Chief Complaint  Patient presents with  . Follow-up   HPI: Patient is a 48 y.o. Caucasian male who is here for 4 wk f/u rash and HTN.   Rash gone s/p d/c of amlodipine. Home bp checks are normal over the last month. He feels good.  Pertinent PMH:  Past medical, surgical, social, and family history reviewed and no changes are noted since last office visit.  MEDS:  Outpatient Prescriptions Prior to Visit  Medication Sig Dispense Refill  . aspirin 81 MG tablet Take 81 mg by mouth daily.      . cetirizine (ZYRTEC) 10 MG tablet Take 10 mg by mouth daily.      . cyanocobalamin 100 MCG tablet Take 100 mcg by mouth daily.      Marland Kitchen lisinopril-hydrochlorothiazide (PRINZIDE,ZESTORETIC) 20-25 MG per tablet Take 1 tablet by mouth daily.  30 tablet  3  . magnesium 30 MG tablet Take 30 mg by mouth 2 (two) times daily.      . simvastatin (ZOCOR) 20 MG tablet Take 1 tablet (20 mg total) by mouth at bedtime.  30 tablet  4   No facility-administered medications prior to visit.    PE: Blood pressure 126/86, pulse 66, temperature 97.9 F (36.6 C), temperature source Temporal, resp. rate 18, height 5' 11"  (1.803 m), weight 250 lb (113.399 kg), SpO2 98.00%. Gen: Alert, well appearing.  Patient is oriented to person, place, time, and situation.   IMPRESSION AND PLAN:  1) Hyperlipidemia: numbers much improved last month.  LDL could use improvement still. He'll try to increase exercise, cont good diet, cont 80m zocor qd. Recheck FLP 664mond consider increase zocor to 4067md if LDL not better.  2) HTN: The current medical regimen is effective;  continue present plan and medications.  3) Ankle rash/swelling: side effect of amlodipine.  Resolved since d/c of this med.  An After Visit Summary was printed and given to the patient.  FOLLOW UP: 31mo30mo

## 2014-03-08 NOTE — Progress Notes (Signed)
Pre visit review using our clinic review tool, if applicable. No additional management support is needed unless otherwise documented below in the visit note. 

## 2014-04-23 ENCOUNTER — Other Ambulatory Visit: Payer: Self-pay | Admitting: Family Medicine

## 2014-06-01 ENCOUNTER — Other Ambulatory Visit: Payer: Self-pay | Admitting: Family Medicine

## 2014-08-27 ENCOUNTER — Ambulatory Visit (INDEPENDENT_AMBULATORY_CARE_PROVIDER_SITE_OTHER): Payer: Managed Care, Other (non HMO) | Admitting: Family Medicine

## 2014-08-27 ENCOUNTER — Encounter: Payer: Self-pay | Admitting: Family Medicine

## 2014-08-27 VITALS — BP 124/87 | HR 68 | Temp 98.1°F | Resp 18 | Ht 71.0 in | Wt 256.0 lb

## 2014-08-27 DIAGNOSIS — R7401 Elevation of levels of liver transaminase levels: Secondary | ICD-10-CM

## 2014-08-27 DIAGNOSIS — R740 Nonspecific elevation of levels of transaminase and lactic acid dehydrogenase [LDH]: Secondary | ICD-10-CM

## 2014-08-27 DIAGNOSIS — I10 Essential (primary) hypertension: Secondary | ICD-10-CM

## 2014-08-27 DIAGNOSIS — E785 Hyperlipidemia, unspecified: Secondary | ICD-10-CM

## 2014-08-27 LAB — COMPREHENSIVE METABOLIC PANEL
ALBUMIN: 4.3 g/dL (ref 3.5–5.2)
ALT: 58 U/L — ABNORMAL HIGH (ref 0–53)
AST: 36 U/L (ref 0–37)
Alkaline Phosphatase: 67 U/L (ref 39–117)
BUN: 13 mg/dL (ref 6–23)
CALCIUM: 9.5 mg/dL (ref 8.4–10.5)
CHLORIDE: 101 meq/L (ref 96–112)
CO2: 24 mEq/L (ref 19–32)
Creatinine, Ser: 1.1 mg/dL (ref 0.4–1.5)
GFR: 80.81 mL/min (ref >60.00)
GLUCOSE: 104 mg/dL — AB (ref 70–99)
POTASSIUM: 3.7 meq/L (ref 3.5–5.1)
Sodium: 134 mEq/L — ABNORMAL LOW (ref 135–145)
Total Bilirubin: 0.7 mg/dL (ref 0.2–1.2)
Total Protein: 6.7 g/dL (ref 6.0–8.3)

## 2014-08-27 LAB — LIPID PANEL
CHOL/HDL RATIO: 4
CHOLESTEROL: 169 mg/dL (ref 0–200)
HDL: 38.5 mg/dL — ABNORMAL LOW (ref >39.00)
LDL Cholesterol: 113 mg/dL — ABNORMAL HIGH (ref 0–99)
NonHDL: 130.5
TRIGLYCERIDES: 89 mg/dL (ref 0.0–149.0)
VLDL: 17.8 mg/dL (ref 0.0–40.0)

## 2014-08-27 NOTE — Progress Notes (Signed)
Pre visit review using our clinic review tool, if applicable. No additional management support is needed unless otherwise documented below in the visit note. 

## 2014-08-27 NOTE — Progress Notes (Signed)
OFFICE VISIT  08/27/2014   CC:  Chief Complaint  Patient presents with  . Follow-up    fasting    HPI:    Patient is a 48 y.o. Caucasian male who presents for 6 mo f/u metabolic syndrome, hyperlipidemia, HTN, hx of elevated transaminase level.  Compliant with meds, feeling fine.  He is fasting today. Home bp's avg <140/90.  ROS: mild anhedonia lately but not consistent.  Denies depressed/sad mood or crying spells.   No CP, palpitations, SOB, or HA's.  No Abd pain.  Appetite is good.     Past Medical History  Diagnosis Date  . Hypertension   . Hyperlipidemia   . Allergic rhinitis   . Other malaise and fatigue 2011    Testosterone normal 2011  . Elevated transaminase level 2009-2013  . Metabolic syndrome   . ALLERGIC RHINITIS 05/12/2007    Qualifier: Diagnosis of  By: Scherrie Gerlach    . Recurrent sinusitis 11/16/2011    Past Surgical History  Procedure Laterality Date  . Shoulder surgery  2013    Left rotator cuff and biceps tendon repair (s/p fall)    Outpatient Prescriptions Prior to Visit  Medication Sig Dispense Refill  . aspirin 81 MG tablet Take 81 mg by mouth daily.    . cetirizine (ZYRTEC) 10 MG tablet Take 10 mg by mouth daily.    . cyanocobalamin 100 MCG tablet Take 100 mcg by mouth daily.    Marland Kitchen lisinopril-hydrochlorothiazide (PRINZIDE,ZESTORETIC) 20-25 MG per tablet TAKE ONE TABLET BY MOUTH EVERY DAY 30 tablet 3  . magnesium 30 MG tablet Take 30 mg by mouth 2 (two) times daily.    . simvastatin (ZOCOR) 20 MG tablet TAKE ONE TABLET BY MOUTH AT BEDTIME 30 tablet 3   No facility-administered medications prior to visit.    No Known Allergies  ROS As per HPI  PE: Blood pressure 124/87, pulse 68, temperature 98.1 F (36.7 C), temperature source Temporal, resp. rate 18, height 5' 11"  (1.803 m), weight 256 lb (116.121 kg), SpO2 95 %. Gen: Alert, well appearing.  Patient is oriented to person, place, time, and situation. CV: RRR, no m/r/g.   LUNGS: CTA  bilat, nonlabored resps, good aeration in all lung fields. EXT: no clubbing, cyanosis, or edema.    LABS:  None today  IMPRESSION AND PLAN:  1) HTN; The current medical regimen is effective;  continue present plan and medications. BMET today.  2) Hyperlipidemia; FLP today.  Tolerating statin as long as he is taking it hs.  3) Hx of elevated transaminases: CMET today.  Viral Hep screen neg in past.  4) Obesity; encouraged him to continue to work on TLC.  An After Visit Summary was printed and given to the patient.  FOLLOW UP: Return in about 6 months (around 02/26/2015) for annual CPE (fasting).

## 2014-09-17 ENCOUNTER — Other Ambulatory Visit: Payer: Self-pay | Admitting: Family Medicine

## 2014-12-20 ENCOUNTER — Other Ambulatory Visit: Payer: Self-pay | Admitting: Family Medicine

## 2015-02-26 ENCOUNTER — Encounter: Payer: Self-pay | Admitting: Family Medicine

## 2015-05-28 ENCOUNTER — Other Ambulatory Visit: Payer: Self-pay | Admitting: Family Medicine

## 2015-05-28 NOTE — Telephone Encounter (Signed)
RF request for lisinopril LOV: 08/27/14 Next ov: None Last written: 12/20/14 #30 w/ 3RF  Left message for pt to call back. Need ov for more refills.

## 2015-06-23 DIAGNOSIS — M75121 Complete rotator cuff tear or rupture of right shoulder, not specified as traumatic: Secondary | ICD-10-CM | POA: Insufficient documentation

## 2015-07-10 DIAGNOSIS — Z9889 Other specified postprocedural states: Secondary | ICD-10-CM | POA: Insufficient documentation

## 2015-07-31 ENCOUNTER — Telehealth: Payer: Self-pay | Admitting: *Deleted

## 2015-07-31 MED ORDER — LISINOPRIL-HYDROCHLOROTHIAZIDE 20-25 MG PO TABS: 1.0000 | ORAL_TABLET | Freq: Every day | ORAL | Status: DC

## 2015-07-31 NOTE — Telephone Encounter (Signed)
RF request for lisinopril/hctz LOV: 08/27/14 Next ov: None Last written: 05/28/15 #30 w/ 0RF  Will send #30 w/ 0RF. Pt needs ov within 1 month for more refills.

## 2015-07-31 NOTE — Telephone Encounter (Signed)
Left message for pt to call back  °

## 2015-08-01 NOTE — Telephone Encounter (Signed)
Left message for pt to call back  °

## 2015-08-20 NOTE — Telephone Encounter (Signed)
Left message for pt to call back  °

## 2015-09-02 NOTE — Telephone Encounter (Signed)
Pt has not retured call will save message to chart.

## 2015-09-28 DIAGNOSIS — S46211A Strain of muscle, fascia and tendon of other parts of biceps, right arm, initial encounter: Secondary | ICD-10-CM | POA: Insufficient documentation

## 2015-09-30 ENCOUNTER — Telehealth: Payer: Self-pay | Admitting: *Deleted

## 2015-09-30 MED ORDER — LISINOPRIL-HYDROCHLOROTHIAZIDE 20-25 MG PO TABS: 1.0000 | ORAL_TABLET | Freq: Every day | ORAL | Status: DC

## 2015-09-30 NOTE — Telephone Encounter (Signed)
[place] and cancelled rx sent earlier. Resent Rx for #30 w/ 2Rf. Pt advised to f/u with in next 3 months to get more refills.

## 2015-09-30 NOTE — Telephone Encounter (Signed)
Pt called requesting refill for lisinopril/hctz. He stated that he just had to have emergency surgery on his shoulder and is unable to come in for ov but will be out of medication soon.   RF request for lisinopril LOV: 08/27/14 Next ov: None Last written: 07/31/15 #30 w/ 0RF  Rx sent for #30 w/ 0RF. Please advise when pt should come in for f/u. Thanks.

## 2015-09-30 NOTE — Telephone Encounter (Signed)
OK to have f/u sometime in the next 3 months.  Ok to send in/authorize 90 d supply of his bp med at this time.-thx

## 2015-11-04 ENCOUNTER — Ambulatory Visit (INDEPENDENT_AMBULATORY_CARE_PROVIDER_SITE_OTHER): Payer: Managed Care, Other (non HMO) | Admitting: Family Medicine

## 2015-11-04 ENCOUNTER — Encounter: Payer: Self-pay | Admitting: Family Medicine

## 2015-11-04 VITALS — BP 147/88 | HR 75 | Temp 97.9°F | Resp 20 | Wt 268.5 lb

## 2015-11-04 DIAGNOSIS — J0100 Acute maxillary sinusitis, unspecified: Secondary | ICD-10-CM | POA: Diagnosis not present

## 2015-11-04 MED ORDER — AMOXICILLIN-POT CLAVULANATE 875-125 MG PO TABS
1.0000 | ORAL_TABLET | Freq: Two times a day (BID) | ORAL | Status: DC
Start: 2015-11-04 — End: 2015-12-10

## 2015-11-04 NOTE — Patient Instructions (Signed)
Flonase, mucinex, rest and hydrate Augmentin prescribed x2 a day for 10 days.   Sinusitis, Adult Sinusitis is redness, soreness, and inflammation of the paranasal sinuses. Paranasal sinuses are air pockets within the bones of your face. They are located beneath your eyes, in the middle of your forehead, and above your eyes. In healthy paranasal sinuses, mucus is able to drain out, and air is able to circulate through them by way of your nose. However, when your paranasal sinuses are inflamed, mucus and air can become trapped. This can allow bacteria and other germs to grow and cause infection. Sinusitis can develop quickly and last only a short time (acute) or continue over a long period (chronic). Sinusitis that lasts for more than 12 weeks is considered chronic. CAUSES Causes of sinusitis include:  Allergies.  Structural abnormalities, such as displacement of the cartilage that separates your nostrils (deviated septum), which can decrease the air flow through your nose and sinuses and affect sinus drainage.  Functional abnormalities, such as when the small hairs (cilia) that line your sinuses and help remove mucus do not work properly or are not present. SIGNS AND SYMPTOMS Symptoms of acute and chronic sinusitis are the same. The primary symptoms are pain and pressure around the affected sinuses. Other symptoms include:  Upper toothache.  Earache.  Headache.  Bad breath.  Decreased sense of smell and taste.  A cough, which worsens when you are lying flat.  Fatigue.  Fever.  Thick drainage from your nose, which often is green and may contain pus (purulent).  Swelling and warmth over the affected sinuses. DIAGNOSIS Your health care provider will perform a physical exam. During your exam, your health care provider may perform any of the following to help determine if you have acute sinusitis or chronic sinusitis:  Look in your nose for signs of abnormal growths in your nostrils  (nasal polyps).  Tap over the affected sinus to check for signs of infection.  View the inside of your sinuses using an imaging device that has a light attached (endoscope). If your health care provider suspects that you have chronic sinusitis, one or more of the following tests may be recommended:  Allergy tests.  Nasal culture. A sample of mucus is taken from your nose, sent to a lab, and screened for bacteria.  Nasal cytology. A sample of mucus is taken from your nose and examined by your health care provider to determine if your sinusitis is related to an allergy. TREATMENT Most cases of acute sinusitis are related to a viral infection and will resolve on their own within 10 days. Sometimes, medicines are prescribed to help relieve symptoms of both acute and chronic sinusitis. These may include pain medicines, decongestants, nasal steroid sprays, or saline sprays. However, for sinusitis related to a bacterial infection, your health care provider will prescribe antibiotic medicines. These are medicines that will help kill the bacteria causing the infection. Rarely, sinusitis is caused by a fungal infection. In these cases, your health care provider will prescribe antifungal medicine. For some cases of chronic sinusitis, surgery is needed. Generally, these are cases in which sinusitis recurs more than 3 times per year, despite other treatments. HOME CARE INSTRUCTIONS  Drink plenty of water. Water helps thin the mucus so your sinuses can drain more easily.  Use a humidifier.  Inhale steam 3-4 times a day (for example, sit in the bathroom with the shower running).  Apply a warm, moist washcloth to your face 3-4 times a day,  or as directed by your health care provider.  Use saline nasal sprays to help moisten and clean your sinuses.  Take medicines only as directed by your health care provider.  If you were prescribed either an antibiotic or antifungal medicine, finish it all even if  you start to feel better. SEEK IMMEDIATE MEDICAL CARE IF:  You have increasing pain or severe headaches.  You have nausea, vomiting, or drowsiness.  You have swelling around your face.  You have vision problems.  You have a stiff neck.  You have difficulty breathing.   This information is not intended to replace advice given to you by your health care provider. Make sure you discuss any questions you have with your health care provider.   Document Released: 08/31/2005 Document Revised: 09/21/2014 Document Reviewed: 09/15/2011 Elsevier Interactive Patient Education [employer].

## 2015-11-04 NOTE — Progress Notes (Signed)
Patient ID: Vernon Mccormick, male   DOB: 1965-12-08, 50 y.o.   MRN: [id number]    Vernon Mccormick , [birth year], 50 y.o., male MRN: [id number]  CC: Facial pressure. Subjective: Pt presents for an acute OV with complaints of PND of  2 weeks duration.  Associated symptoms include nasal congestion, chest congestion, rhinorrhea, ear fullness, sinus pain.  Pt feels symptoms are worse at night. Patient deneis nausea, vomit, diarrhea, fever or chills. Patient has taken corticidn only. Daughter had the flu a few weeks ago.  Tdap UTD, FLu UTD.  No Known Allergies Social History  Substance Use Topics  . Smoking status: Never Smoker   . Smokeless tobacco: Former Systems developer    Types: Chew  . Alcohol Use: Yes     Comment: socially   Past Medical History  Diagnosis Date  . Hypertension   . Hyperlipidemia   . Allergic rhinitis   . Other malaise and fatigue 2011    Testosterone normal 2011  . Elevated transaminase level 2009-2013  . Metabolic syndrome   . ALLERGIC RHINITIS 05/12/2007    Qualifier: Diagnosis of  By: Scherrie Gerlach    . Recurrent sinusitis 11/16/2011   Past Surgical History  Procedure Laterality Date  . Shoulder surgery  2013    Left rotator cuff and biceps tendon repair (s/p fall)   Family History  Problem Relation Age of Onset  . Hypertension Father   . Stroke Paternal Grandfather   . Heart disease Paternal Grandfather   . Hypertension Paternal Grandfather   . Diabetes Paternal Grandfather   . Kidney disease Paternal Grandfather      Medication List       This list is accurate as of: 11/04/15  4:21 PM.  Always use your most recent med list.               aspirin 81 MG tablet  Take 81 mg by mouth daily.     cetirizine 10 MG tablet  Commonly known as:  ZYRTEC  Take 10 mg by mouth daily.     cyanocobalamin 100 MCG tablet  Take 100 mcg by mouth daily.     ibuprofen 800 MG tablet  Commonly known as:  ADVIL,MOTRIN  Take by mouth.     lisinopril-hydrochlorothiazide 20-25 MG tablet  Commonly known as:  PRINZIDE,ZESTORETIC  Take 1 tablet by mouth daily.     magnesium 30 MG tablet  Take 30 mg by mouth 2 (two) times daily.       ROS: Negative, with the exception of above mentioned in HPI   Objective:  BP 147/88 mmHg  Pulse 75  Temp(Src) 97.9 F (36.6 C)  Resp 20  Wt 268 lb 8 oz (121.791 kg)  SpO2 96% Body mass index is 37.46 kg/(m^2). Gen: Afebrile. No acute distress. Nontoxic in appearance, well-developed, well-nourished, Caucasian male. Pleasant. HENT: AT. [place]. Bilateral TM visualized and normal in appearance. MMM, no oral lesions. Bilateral nares with erythema and swelling. Throat without erythema or exudates. No cough on exam. No hoarseness on exam. Right maxillary sinus tenderness to palpation. Eyes:Pupils Equal Round Reactive to light, Extraocular movements intact,  Conjunctiva without redness, discharge or icterus. Neck/lymp/endocrine: Supple, no lymphadenopathy CV: RRR  Chest: CTAB, no wheeze or crackles. Good air movement, normal resp effort.  Abd: Soft. NTND. BS present. Skin: No rashes, purpura or petechiae.  Neuro: Normal gait. PERLA. EOMi. Alert. Oriented x3  Assessment/Plan: Vernon Mccormick is a 50 y.o. male present for acute OV for  1. Acute maxillary sinusitis, recurrence not specified - amoxicillin-clavulanate (AUGMENTIN) 875-125 MG tablet; Take 1 tablet by mouth 2 (two) times daily.  Dispense: 20 tablet; Refill: 0 - Flonase, mucinex, rest and hydrate - F/U PRN  Howard Pouch, DO  [place]

## 2015-12-03 ENCOUNTER — Encounter: Payer: Managed Care, Other (non HMO) | Admitting: Family Medicine

## 2015-12-10 ENCOUNTER — Encounter: Payer: Self-pay | Admitting: Family Medicine

## 2015-12-10 ENCOUNTER — Ambulatory Visit (INDEPENDENT_AMBULATORY_CARE_PROVIDER_SITE_OTHER): Payer: Managed Care, Other (non HMO) | Admitting: Family Medicine

## 2015-12-10 VITALS — BP 121/83 | HR 76 | Temp 98.0°F | Ht 70.5 in | Wt 266.4 lb

## 2015-12-10 DIAGNOSIS — Z0000 Encounter for general adult medical examination without abnormal findings: Secondary | ICD-10-CM | POA: Diagnosis not present

## 2015-12-10 LAB — COMPREHENSIVE METABOLIC PANEL
ALT: 91 U/L — ABNORMAL HIGH (ref 0–53)
AST: 44 U/L — ABNORMAL HIGH (ref 0–37)
Albumin: 4.4 g/dL (ref 3.5–5.2)
Alkaline Phosphatase: 74 U/L (ref 39–117)
BUN: 15 mg/dL (ref 6–23)
CHLORIDE: 101 meq/L (ref 96–112)
CO2: 28 mEq/L (ref 19–32)
Calcium: 9.5 mg/dL (ref 8.4–10.5)
Creatinine, Ser: 0.98 mg/dL (ref 0.40–1.50)
GFR: 87.02 mL/min (ref >60.00)
GLUCOSE: 98 mg/dL (ref 70–99)
POTASSIUM: 3.9 meq/L (ref 3.5–5.1)
SODIUM: 138 meq/L (ref 135–145)
Total Bilirubin: 0.7 mg/dL (ref 0.2–1.2)
Total Protein: 6.7 g/dL (ref 6.0–8.3)

## 2015-12-10 LAB — CBC WITH DIFFERENTIAL/PLATELET
BASOS PCT: 0.5 % (ref 0.0–3.0)
Basophils Absolute: 0 10*3/uL (ref 0.0–0.1)
EOS ABS: 0.2 10*3/uL (ref 0.0–0.7)
Eosinophils Relative: 2.2 % (ref 0.0–5.0)
HCT: 47.9 % (ref 39.0–52.0)
Hemoglobin: 15.8 g/dL (ref 13.0–17.0)
Lymphocytes Relative: 35.1 % (ref 12.0–46.0)
Lymphs Abs: 2.9 10*3/uL (ref 0.7–4.0)
MCHC: 33 g/dL (ref 30.0–36.0)
MCV: 87.2 fl (ref 78.0–100.0)
MONO ABS: 0.7 10*3/uL (ref 0.1–1.0)
Monocytes Relative: 8.2 % (ref 3.0–12.0)
NEUTROS PCT: 54 % (ref 43.0–77.0)
Neutro Abs: 4.4 10*3/uL (ref 1.4–7.7)
PLATELETS: 250 10*3/uL (ref 150.0–400.0)
RBC: 5.49 Mil/uL (ref 4.22–5.81)
RDW: 14.2 % (ref 11.5–15.5)
WBC: 8.2 10*3/uL (ref 4.0–10.5)

## 2015-12-10 LAB — LIPID PANEL
CHOLESTEROL: 218 mg/dL — AB (ref 0–200)
HDL: 39.3 mg/dL (ref >39.00)
LDL Cholesterol: 149 mg/dL — ABNORMAL HIGH (ref 0–99)
NonHDL: 178.27
TRIGLYCERIDES: 147 mg/dL (ref 0.0–149.0)
Total CHOL/HDL Ratio: 6
VLDL: 29.4 mg/dL (ref 0.0–40.0)

## 2015-12-10 LAB — TSH: TSH: 2.73 u[IU]/mL (ref 0.35–4.50)

## 2015-12-10 MED ORDER — CETIRIZINE HCL 10 MG PO TABS: 10.0000 mg | ORAL_TABLET | Freq: Every day | ORAL | Status: DC

## 2015-12-10 NOTE — Progress Notes (Signed)
Office Note 12/10/2015  CC:  Chief Complaint  Patient presents with  . Annual Exam    HPI:  Vernon Mccormick is a 50 y.o. White male who is here for annual health maintenance exam. Feeling well lately. Not much exercise these days--R shoulder injury with subsequent surgery this winter, still going throuh PT.   Past Medical History  Diagnosis Date  . Hypertension   . Hyperlipidemia   . Allergic rhinitis   . Other malaise and fatigue 2011    Testosterone normal 2011  . Elevated transaminase level 2009-2013  . Metabolic syndrome   . ALLERGIC RHINITIS 05/12/2007    Qualifier: Diagnosis of  By: Scherrie Gerlach    . Recurrent sinusitis 11/16/2011    Past Surgical History  Procedure Laterality Date  . Shoulder surgery  2013; 2016    Left rotator cuff and biceps tendon repair (s/p fall)-2013, left.  Right 2016.    Family History  Problem Relation Age of Onset  . Hypertension Father   . Stroke Paternal Grandfather   . Heart disease Paternal Grandfather   . Hypertension Paternal Grandfather   . Diabetes Paternal Grandfather   . Kidney disease Paternal Grandfather   . Hypertension Mother   . Alzheimer's disease Mother   . Celiac disease Mother   . Osteoporosis Mother     Social History   Social History  . Marital Status: Married    Spouse Name: Almyra Free  . Number of Children: 2  . Years of Education: HS   Occupational History  .  Other    [employer]   Social History Main Topics  . Smoking status: Never Smoker   . Smokeless tobacco: Former Systems developer    Types: Chew  . Alcohol Use: Yes     Comment: socially  . Drug Use: No  . Sexual Activity: Not on file   Other Topics Concern  . Not on file   Social History Narrative   Married, 2 kids.   Long time local resident, grad of [employer].     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   No Tob.  Rare/social ETOH.  No drugs.   MEDS: not taking augmentin listed below Outpatient Prescriptions Prior to Visit   Medication Sig Dispense Refill  . aspirin 81 MG tablet Take 81 mg by mouth daily.    . cyanocobalamin 100 MCG tablet Take 100 mcg by mouth daily.    Marland Kitchen ibuprofen (ADVIL,MOTRIN) 800 MG tablet Take by mouth.    Marland Kitchen lisinopril-hydrochlorothiazide (PRINZIDE,ZESTORETIC) 20-25 MG tablet Take 1 tablet by mouth daily. 30 tablet 2  . amoxicillin-clavulanate (AUGMENTIN) 875-125 MG tablet Take 1 tablet by mouth 2 (two) times daily. 20 tablet 0  . cetirizine (ZYRTEC) 10 MG tablet Take 10 mg by mouth daily. Reported on 12/10/2015    . magnesium 30 MG tablet Take 30 mg by mouth 2 (two) times daily.     No facility-administered medications prior to visit.    No Known Allergies  ROS Review of Systems  Constitutional: Negative for fever, chills, appetite change and fatigue.  HENT: Negative for congestion, dental problem, ear pain and sore throat.   Eyes: Negative for discharge, redness and visual disturbance.  Respiratory: Negative for cough, chest tightness, shortness of breath and wheezing.   Cardiovascular: Negative for chest pain, palpitations and leg swelling.  Gastrointestinal: Negative for nausea, vomiting, abdominal pain, diarrhea and blood in stool.  Genitourinary: Negative for dysuria, urgency, frequency, hematuria, flank pain and difficulty urinating.  Musculoskeletal: Positive for arthralgias (R shoulder pain since recent surgery). Negative for myalgias, back pain, joint swelling and neck stiffness.  Skin: Negative for pallor and rash.  Neurological: Negative for dizziness, speech difficulty, weakness and headaches.  Hematological: Negative for adenopathy. Does not bruise/bleed easily.  Psychiatric/Behavioral: Negative for confusion and sleep disturbance. The patient is not nervous/anxious.     PE; Blood pressure 121/83, pulse 76, temperature 98 F (36.7 C), temperature source Oral, height 5' 10.5" (1.791 m), weight 266 lb 6.4 oz (120.838 kg), SpO2 97 %. Gen: Alert, well appearing.  Patient  is oriented to person, place, time, and situation. AFFECT: pleasant, lucid thought and speech. ENT: Ears: EACs clear, normal epithelium.  TMs with good light reflex and landmarks bilaterally.  Eyes: no injection, icteris, swelling, or exudate.  EOMI, PERRLA. Nose: no drainage or turbinate edema/swelling.  No injection or focal lesion.  Mouth: lips without lesion/swelling.  Oral mucosa pink and moist.  Dentition intact and without obvious caries or gingival swelling.  Oropharynx without erythema, exudate, or swelling.  Neck: supple/nontender.  No LAD, mass, or TM.  Carotid pulses 2+ bilaterally, without bruits. CV: RRR, no m/r/g.   LUNGS: CTA bilat, nonlabored resps, good aeration in all lung fields. ABD: soft, NT, ND, BS normal.  No hepatospenomegaly or mass.  No bruits. EXT: no clubbing, cyanosis, or edema.  Musculoskeletal: no joint swelling, erythema, warmth, or tenderness.  ROM of all joints intact. Skin - no sores or suspicious lesions or rashes or color changes  Pertinent labs:  Lab Results  Component Value Date   TSH 0.55 05/09/2013   Lab Results  Component Value Date   WBC 8.0 05/09/2013   HGB 16.2 05/09/2013   HCT 47.6 05/09/2013   MCV 85.3 05/09/2013   PLT 242.0 05/09/2013   Lab Results  Component Value Date   CREATININE 1.1 08/27/2014   BUN 13 08/27/2014   NA 134* 08/27/2014   K 3.7 08/27/2014   CL 101 08/27/2014   CO2 24 08/27/2014   Lab Results  Component Value Date   ALT 58* 08/27/2014   AST 36 08/27/2014   ALKPHOS 67 08/27/2014   BILITOT 0.7 08/27/2014   Lab Results  Component Value Date   CHOL 169 08/27/2014   Lab Results  Component Value Date   HDL 38.50* 08/27/2014   Lab Results  Component Value Date   LDLCALC 113* 08/27/2014   Lab Results  Component Value Date   TRIG 89.0 08/27/2014   Lab Results  Component Value Date   CHOLHDL 4 08/27/2014    ASSESSMENT AND PLAN:   Health maintenance exam: Reviewed age and gender appropriate health  maintenance issues (prudent diet, regular exercise, health risks of tobacco and excessive alcohol, use of seatbelts, fire alarms in home, use of sunscreen).  Also reviewed age and gender appropriate health screening as well as vaccine recommendations. Fasting health panel labs drawn today. No vaccines due.  An After Visit Summary was printed and given to the patient.  FOLLOW UP:  Return in about 6 months (around 06/11/2016) for routine chronic illness f/u.  Signed:  Crissie Sickles, MD           12/10/2015

## 2015-12-10 NOTE — Progress Notes (Signed)
Pre visit review using our clinic review tool, if applicable. No additional management support is needed unless otherwise documented below in the visit note. 

## 2015-12-11 ENCOUNTER — Other Ambulatory Visit: Payer: Self-pay | Admitting: *Deleted

## 2015-12-11 MED ORDER — LISINOPRIL-HYDROCHLOROTHIAZIDE 20-25 MG PO TABS: 1.0000 | ORAL_TABLET | Freq: Every day | ORAL | Status: DC

## 2015-12-11 NOTE — Telephone Encounter (Signed)
RF request for lisinopril/hctz LOV: 12/10/15 Next ov: 06/11/16 Last written: 09/30/15 #30 w/ 2RF

## 2015-12-12 ENCOUNTER — Encounter: Payer: Self-pay | Admitting: Family Medicine

## 2015-12-13 ENCOUNTER — Other Ambulatory Visit: Payer: Self-pay | Admitting: *Deleted

## 2015-12-13 DIAGNOSIS — E785 Hyperlipidemia, unspecified: Secondary | ICD-10-CM

## 2015-12-13 DIAGNOSIS — K7581 Nonalcoholic steatohepatitis (NASH): Secondary | ICD-10-CM

## 2015-12-13 MED ORDER — ATORVASTATIN CALCIUM 40 MG PO TABS: 40.0000 mg | ORAL_TABLET | Freq: Every day | ORAL | Status: DC

## 2015-12-17 ENCOUNTER — Telehealth: Payer: Self-pay | Admitting: *Deleted

## 2015-12-17 MED ORDER — AMOXICILLIN-POT CLAVULANATE 875-125 MG PO TABS: 1.0000 | ORAL_TABLET | Freq: Two times a day (BID) | ORAL | Status: DC

## 2015-12-17 NOTE — Telephone Encounter (Signed)
Augmentin eRx'd. He should also be using flonase (recommended by Dr. Raoul Pitch at the visit in February) and saline nasal spray.  He can stop zyrtec and try a switch to allegra (otc generic 180 mg per day)-thx

## 2015-12-17 NOTE — Telephone Encounter (Signed)
Pt called and stated that he was in recently to see Dr. Anitra Lauth and was started on Zyrtec. He stated that since then his symptoms have not improved, they have worsened. He stated that before he came in to see Dr. Anitra Lauth he seen Dr. Raoul Pitch and was treated for sinus infection. He stated that he has a sore throat, nasal congestion with yellow/green discharge. He stated that he has some popping in his right ear when he swallows. He stated that he is unable to schedule an apt to be seen. He stated that his mother passed away yesterday and he is trying to make arrangements for that. He is requesting an antibiotic. He stated that he has been taking the zyrtec and mucinex and they have not helped. Please advise. Thanks. Pharmacy: [facility]

## 2015-12-17 NOTE — Telephone Encounter (Signed)
Left detailed message on cell vm, okay per DPR.  

## 2016-04-20 ENCOUNTER — Other Ambulatory Visit: Payer: Self-pay | Admitting: Family Medicine

## 2016-04-20 DIAGNOSIS — K7581 Nonalcoholic steatohepatitis (NASH): Secondary | ICD-10-CM

## 2016-04-20 DIAGNOSIS — E785 Hyperlipidemia, unspecified: Secondary | ICD-10-CM

## 2016-06-05 ENCOUNTER — Other Ambulatory Visit: Payer: Self-pay | Admitting: Family Medicine

## 2016-06-05 DIAGNOSIS — E785 Hyperlipidemia, unspecified: Secondary | ICD-10-CM

## 2016-06-05 DIAGNOSIS — K7581 Nonalcoholic steatohepatitis (NASH): Secondary | ICD-10-CM

## 2016-06-11 ENCOUNTER — Ambulatory Visit: Payer: Managed Care, Other (non HMO) | Admitting: Family Medicine

## 2016-06-16 ENCOUNTER — Ambulatory Visit (INDEPENDENT_AMBULATORY_CARE_PROVIDER_SITE_OTHER): Payer: Managed Care, Other (non HMO) | Admitting: Family Medicine

## 2016-06-16 ENCOUNTER — Encounter: Payer: Self-pay | Admitting: Family Medicine

## 2016-06-16 VITALS — BP 136/88 | HR 66 | Temp 98.1°F | Resp 16 | Wt 262.1 lb

## 2016-06-16 DIAGNOSIS — I10 Essential (primary) hypertension: Secondary | ICD-10-CM | POA: Diagnosis not present

## 2016-06-16 DIAGNOSIS — E7800 Pure hypercholesterolemia, unspecified: Secondary | ICD-10-CM | POA: Diagnosis not present

## 2016-06-16 DIAGNOSIS — K7581 Nonalcoholic steatohepatitis (NASH): Secondary | ICD-10-CM | POA: Diagnosis not present

## 2016-06-16 LAB — COMPREHENSIVE METABOLIC PANEL
ALBUMIN: 4.1 g/dL (ref 3.5–5.2)
ALK PHOS: 72 U/L (ref 39–117)
ALT: 45 U/L (ref 0–53)
AST: 29 U/L (ref 0–37)
BILIRUBIN TOTAL: 0.6 mg/dL (ref 0.2–1.2)
BUN: 17 mg/dL (ref 6–23)
CALCIUM: 9 mg/dL (ref 8.4–10.5)
CO2: 29 mEq/L (ref 19–32)
Chloride: 99 mEq/L (ref 96–112)
Creatinine, Ser: 1 mg/dL (ref 0.40–1.50)
GFR: 84.82 mL/min (ref >60.00)
GLUCOSE: 89 mg/dL (ref 70–99)
POTASSIUM: 3.7 meq/L (ref 3.5–5.1)
Sodium: 137 mEq/L (ref 135–145)
TOTAL PROTEIN: 6.7 g/dL (ref 6.0–8.3)

## 2016-06-16 LAB — LIPID PANEL
CHOL/HDL RATIO: 5
CHOLESTEROL: 196 mg/dL (ref 0–200)
HDL: 38.7 mg/dL — ABNORMAL LOW (ref >39.00)
LDL CALC: 126 mg/dL — AB (ref 0–99)
NonHDL: 157.43
Triglycerides: 159 mg/dL — ABNORMAL HIGH (ref 0.0–149.0)
VLDL: 31.8 mg/dL (ref 0.0–40.0)

## 2016-06-16 NOTE — Progress Notes (Signed)
Pre visit review using our clinic review tool, if applicable. No additional management support is needed unless otherwise documented below in the visit note. 

## 2016-06-16 NOTE — Progress Notes (Signed)
OFFICE VISIT  06/16/2016   CC:  Chief Complaint  Patient presents with  . Follow-up    HTN, Hyperlipidemia   HPI:    Patient is a 50 y.o.  male who presents for 6 mo f/u HTN, hyperlipidemia, NASH. He is fasting.  No meds taken yet today.  Feeling very well.  He was aching more on the statin so stopped it last month and started a vitamin supplement regimen and has started making some TLC changes in the last few months.  He is agreeable to restart of atorvastatin if lipids still too high today.  Compliant with bp med daily.  Occ bp monitoring shows 130s/80 avg.  Past Medical History:  Diagnosis Date  . Allergic rhinitis   . ALLERGIC RHINITIS 05/12/2007   Qualifier: Diagnosis of  By: Scherrie Gerlach    . Elevated transaminase level 2009-2013   suspect NASH (2017)-pt preferred to simply start statin and skip abd u/s.  Viral Hep serologies neg.  Marland Kitchen Hyperlipidemia   . Hypertension   . Metabolic syndrome   . Other malaise and fatigue 2011   Testosterone normal 2011  . Recurrent sinusitis 11/16/2011    Past Surgical History:  Procedure Laterality Date  . SHOULDER SURGERY  2013; 2016   Left rotator cuff and biceps tendon repair (s/p fall)-2013, left.  Right 2016.    Outpatient Medications Prior to Visit  Medication Sig Dispense Refill  . aspirin 81 MG tablet Take 81 mg by mouth daily.    Marland Kitchen atorvastatin (LIPITOR) 40 MG tablet TAKE ONE TABLET BY MOUTH EVERY DAY 30 tablet 3  . cetirizine (ZYRTEC) 10 MG tablet Take 1 tablet (10 mg total) by mouth daily. Reported on 12/10/2015 30 tablet 6  . cyanocobalamin 100 MCG tablet Take 100 mcg by mouth daily.    Marland Kitchen ibuprofen (ADVIL,MOTRIN) 800 MG tablet Take by mouth.    Marland Kitchen lisinopril-hydrochlorothiazide (PRINZIDE,ZESTORETIC) 20-25 MG tablet Take 1 tablet by mouth daily. 30 tablet 11  . amoxicillin-clavulanate (AUGMENTIN) 875-125 MG tablet Take 1 tablet by mouth 2 (two) times daily. 20 tablet 0   No facility-administered medications prior to  visit.     No Known Allergies  ROS As per HPI  PE: Blood pressure 136/88, pulse 66, temperature 98.1 F (36.7 C), temperature source Oral, resp. rate 16, weight 262 lb 1.9 oz (118.9 kg), SpO2 98 %. Gen: Alert, well appearing.  Patient is oriented to person, place, time, and situation. CV: RRR, no m/r/g.   LUNGS: CTA bilat, nonlabored resps, good aeration in all lung fields. EXT: no clubbing, cyanosis, or edema.    LABS:  Lab Results  Component Value Date   TSH 2.73 12/10/2015   Lab Results  Component Value Date   WBC 8.2 12/10/2015   HGB 15.8 12/10/2015   HCT 47.9 12/10/2015   MCV 87.2 12/10/2015   PLT 250.0 12/10/2015   Lab Results  Component Value Date   CREATININE 0.98 12/10/2015   BUN 15 12/10/2015   NA 138 12/10/2015   K 3.9 12/10/2015   CL 101 12/10/2015   CO2 28 12/10/2015   Lab Results  Component Value Date   ALT 91 (H) 12/10/2015   AST 44 (H) 12/10/2015   ALKPHOS 74 12/10/2015   BILITOT 0.7 12/10/2015   Lab Results  Component Value Date   CHOL 218 (H) 12/10/2015   Lab Results  Component Value Date   HDL 39.30 12/10/2015   Lab Results  Component Value Date   LDLCALC 149 (  H) 12/10/2015   Lab Results  Component Value Date   TRIG 147.0 12/10/2015   Lab Results  Component Value Date   CHOLHDL 6 12/10/2015    IMPRESSION AND PLAN:  1) Hyperlipidemia: took statin for 5 mo but felt more myalgias on it, so 1 mo ago he stopped it. He has increased efforts at [facility], also taking vitamin supplement pack and says he feels great. FLP and CMET today.  2) NASH: CMET today.  3) HTN: The current medical regimen is effective;  continue present plan and medications. Lytes/cr today.  An After Visit Summary was printed and given to the patient.  FOLLOW UP: Return in about 6 months (around 12/15/2016) for annual CPE (fasting).  Signed:  Crissie Sickles, MD           06/16/2016

## 2016-11-27 ENCOUNTER — Encounter: Payer: Self-pay | Admitting: Family Medicine

## 2016-11-27 ENCOUNTER — Ambulatory Visit (INDEPENDENT_AMBULATORY_CARE_PROVIDER_SITE_OTHER): Payer: Managed Care, Other (non HMO) | Admitting: Family Medicine

## 2016-11-27 VITALS — BP 134/90 | HR 80 | Temp 98.4°F | Resp 16 | Ht 70.5 in | Wt 267.2 lb

## 2016-11-27 DIAGNOSIS — J309 Allergic rhinitis, unspecified: Secondary | ICD-10-CM | POA: Diagnosis not present

## 2016-11-27 DIAGNOSIS — B9789 Other viral agents as the cause of diseases classified elsewhere: Secondary | ICD-10-CM | POA: Diagnosis not present

## 2016-11-27 DIAGNOSIS — J069 Acute upper respiratory infection, unspecified: Secondary | ICD-10-CM

## 2016-11-27 DIAGNOSIS — R0789 Other chest pain: Secondary | ICD-10-CM

## 2016-11-27 MED ORDER — PREDNISONE 20 MG PO TABS: ORAL_TABLET | ORAL | 0 refills | Status: DC

## 2016-11-27 NOTE — Patient Instructions (Signed)
Get otc generic robitussin DM OR Mucinex DM and use as directed on the packaging for cough and congestion. Use otc generic saline nasal spray 2-3 times per day to irrigate/moisturize your nasal passages.   

## 2016-11-27 NOTE — Progress Notes (Signed)
OFFICE VISIT  11/27/2016   CC:  Chief Complaint  Patient presents with  . Congestion    nasal and chest x 2-3 weeks, with productive cough, pain on left side when taking deep breath or coughing   HPI:    Patient is a 51 y.o. Caucasian male who presents for respiratory complaints. Onset of nasal congestion, runny nose, PND about 3 weeks ago.  Cough mild at start but this is getting worse and feeling deeper.  When he takes deep breath he gets L sided sharp CP. No SOB or wheezing.  No fever.  Trying tylenol cold/flu.  Takes zyrtec daily.  No nasal spray. No pain in face or upper teeth.  Past Medical History:  Diagnosis Date  . Allergic rhinitis   . ALLERGIC RHINITIS 05/12/2007   Qualifier: Diagnosis of  By: Scherrie Gerlach    . Elevated transaminase level 2009-2013   suspect NASH (2017)-pt preferred to simply start statin and skip abd u/s.  Viral Hep serologies neg.  Marland Kitchen Hyperlipidemia   . Hypertension   . Metabolic syndrome   . Other malaise and fatigue 2011   Testosterone normal 2011  . Recurrent sinusitis 11/16/2011    Past Surgical History:  Procedure Laterality Date  . SHOULDER SURGERY  2013; 2016   Left rotator cuff and biceps tendon repair (s/p fall)-2013, left.  Right 2016.    Outpatient Medications Prior to Visit  Medication Sig Dispense Refill  . aspirin 81 MG tablet Take 81 mg by mouth daily.    . cetirizine (ZYRTEC) 10 MG tablet Take 1 tablet (10 mg total) by mouth daily. Reported on 12/10/2015 30 tablet 6  . cyanocobalamin 100 MCG tablet Take 100 mcg by mouth daily.    Marland Kitchen ibuprofen (ADVIL,MOTRIN) 800 MG tablet Take by mouth.    Marland Kitchen lisinopril-hydrochlorothiazide (PRINZIDE,ZESTORETIC) 20-25 MG tablet Take 1 tablet by mouth daily. 30 tablet 11  . atorvastatin (LIPITOR) 40 MG tablet TAKE ONE TABLET BY MOUTH EVERY DAY (Patient not taking: Reported on 11/27/2016) 30 tablet 3   No facility-administered medications prior to visit.     No Known Allergies  ROS As per  HPI  PE: Blood pressure 134/90, pulse 80, temperature 98.4 F (36.9 C), temperature source Oral, resp. rate 16, height 5' 10.5" (1.791 m), weight 267 lb 4 oz (121.2 kg), SpO2 96 %. VS: noted--normal. Gen: alert, NAD, NONTOXIC APPEARING. HEENT: eyes without injection, drainage, or swelling.  Ears: EACs clear, TMs with normal light reflex and landmarks.  Nose: Clear rhinorrhea, with some dried, crusty exudate adherent to mildly injected mucosa.  No purulent d/c.  No paranasal sinus TTP.  No facial swelling.  Throat and mouth without focal lesion.  No pharyngial swelling, erythema, or exudate.   Neck: supple, no LAD.   LUNGS: CTA bilat, nonlabored resps.   CV: RRR, no m/r/g.  Chest wall--mild TTP in area just inferior to L nipple EXT: no c/c/e SKIN: no rash  LABS:    Chemistry      Component Value Date/Time   NA 137 06/16/2016 0823   K 3.7 06/16/2016 0823   CL 99 06/16/2016 0823   CO2 29 06/16/2016 0823   BUN 17 06/16/2016 0823   CREATININE 1.00 06/16/2016 0823      Component Value Date/Time   CALCIUM 9.0 06/16/2016 0823   ALKPHOS 72 06/16/2016 0823   AST 29 06/16/2016 0823   ALT 45 06/16/2016 0823   BILITOT 0.6 06/16/2016 0823      IMPRESSION AND  PLAN:  Viral URI with cough.  He has mild intercostal strain on L. No sign of bacterial infection or RAD.  I do think he has a significant component of allergic rhinitis/sinusitis. Will treat with prednisone 28m qd x 5d. Get otc generic robitussin DM OR Mucinex DM and use as directed on the packaging for cough and congestion. Use otc generic saline nasal spray 2-3 times per day to irrigate/moisturize your nasal passages.  An After Visit Summary was printed and given to the patient.  FOLLOW UP: Return if symptoms worsen or fail to improve.  Signed:  PCrissie Sickles MD           11/27/2016

## 2016-11-27 NOTE — Progress Notes (Signed)
Pre visit review using our clinic review tool, if applicable. No additional management support is needed unless otherwise documented below in the visit note. 

## 2016-12-23 ENCOUNTER — Ambulatory Visit (INDEPENDENT_AMBULATORY_CARE_PROVIDER_SITE_OTHER): Payer: Managed Care, Other (non HMO) | Admitting: Family Medicine

## 2016-12-23 ENCOUNTER — Encounter: Payer: Self-pay | Admitting: Family Medicine

## 2016-12-23 VITALS — BP 147/85 | HR 62 | Temp 98.1°F | Resp 16 | Ht 71.0 in | Wt 266.5 lb

## 2016-12-23 DIAGNOSIS — Z0000 Encounter for general adult medical examination without abnormal findings: Secondary | ICD-10-CM

## 2016-12-23 LAB — COMPREHENSIVE METABOLIC PANEL
ALT: 57 U/L — ABNORMAL HIGH (ref 0–53)
AST: 33 U/L (ref 0–37)
Albumin: 4.3 g/dL (ref 3.5–5.2)
Alkaline Phosphatase: 72 U/L (ref 39–117)
BUN: 15 mg/dL (ref 6–23)
CO2: 27 mEq/L (ref 19–32)
Calcium: 9.2 mg/dL (ref 8.4–10.5)
Chloride: 99 mEq/L (ref 96–112)
Creatinine, Ser: 1.02 mg/dL (ref 0.40–1.50)
GFR: 82.73 mL/min (ref >60.00)
Glucose, Bld: 97 mg/dL (ref 70–99)
POTASSIUM: 3.7 meq/L (ref 3.5–5.1)
SODIUM: 136 meq/L (ref 135–145)
Total Bilirubin: 0.5 mg/dL (ref 0.2–1.2)
Total Protein: 6.8 g/dL (ref 6.0–8.3)

## 2016-12-23 LAB — CBC WITH DIFFERENTIAL/PLATELET
Basophils Absolute: 0 10*3/uL (ref 0.0–0.1)
Basophils Relative: 0.5 % (ref 0.0–3.0)
EOS ABS: 0.1 10*3/uL (ref 0.0–0.7)
Eosinophils Relative: 2.2 % (ref 0.0–5.0)
HCT: 46.4 % (ref 39.0–52.0)
HEMOGLOBIN: 15.8 g/dL (ref 13.0–17.0)
Lymphocytes Relative: 43.9 % (ref 12.0–46.0)
Lymphs Abs: 2.9 10*3/uL (ref 0.7–4.0)
MCHC: 34 g/dL (ref 30.0–36.0)
MCV: 85.4 fl (ref 78.0–100.0)
MONO ABS: 0.4 10*3/uL (ref 0.1–1.0)
Monocytes Relative: 6.7 % (ref 3.0–12.0)
Neutro Abs: 3 10*3/uL (ref 1.4–7.7)
Neutrophils Relative %: 46.7 % (ref 43.0–77.0)
Platelets: 225 10*3/uL (ref 150.0–400.0)
RBC: 5.44 Mil/uL (ref 4.22–5.81)
RDW: 13.7 % (ref 11.5–15.5)
WBC: 6.5 10*3/uL (ref 4.0–10.5)

## 2016-12-23 LAB — LIPID PANEL
CHOLESTEROL: 195 mg/dL (ref 0–200)
HDL: 32.8 mg/dL — AB (ref >39.00)
LDL Cholesterol: 127 mg/dL — ABNORMAL HIGH (ref 0–99)
NonHDL: 162.59
Total CHOL/HDL Ratio: 6
Triglycerides: 179 mg/dL — ABNORMAL HIGH (ref 0.0–149.0)
VLDL: 35.8 mg/dL (ref 0.0–40.0)

## 2016-12-23 LAB — TSH: TSH: 2.42 u[IU]/mL (ref 0.35–4.50)

## 2016-12-23 MED ORDER — LISINOPRIL-HYDROCHLOROTHIAZIDE 20-25 MG PO TABS: 1.0000 | ORAL_TABLET | Freq: Every day | ORAL | 11 refills | Status: DC

## 2016-12-23 NOTE — Patient Instructions (Signed)
 Health Maintenance, Male A healthy lifestyle and preventive care is important for your health and wellness. Ask your health care provider about what schedule of regular examinations is right for you. What should I know about weight and diet?  Eat a Healthy Diet  Eat plenty of vegetables, fruits, whole grains, low-fat dairy products, and lean protein.  Do not eat a lot of foods high in solid fats, added sugars, or salt. Maintain a Healthy Weight  Regular exercise can help you achieve or maintain a healthy weight. You should:  Do at least 150 minutes of exercise each week. The exercise should increase your heart rate and make you sweat (moderate-intensity exercise).  Do strength-training exercises at least twice a week. Watch Your Levels of Cholesterol and Blood Lipids  Have your blood tested for lipids and cholesterol every 5 years starting at 49 years of age. If you are at high risk for heart disease, you should start having your blood tested when you are 49 years old. You may need to have your cholesterol levels checked more often if:  Your lipid or cholesterol levels are high.  You are older than 50 years of age.  You are at high risk for heart disease. What should I know about cancer screening? Many types of cancers can be detected early and may often be prevented. Lung Cancer  You should be screened every year for lung cancer if:  You are a current smoker who has smoked for at least 30 years.  You are a former smoker who has quit within the past 15 years.  Talk to your health care provider about your screening options, when you should start screening, and how often you should be screened. Colorectal Cancer  Routine colorectal cancer screening usually begins at 50 years of age and should be repeated every 5-10 years until you are 49 years old. You may need to be screened more often if early forms of precancerous polyps or small growths are found. Your health care provider  may recommend screening at an earlier age if you have risk factors for colon cancer.  Your health care provider may recommend using home test kits to check for hidden blood in the stool.  A small camera at the end of a tube can be used to examine your colon (sigmoidoscopy or colonoscopy). This checks for the earliest forms of colorectal cancer. Prostate and Testicular Cancer  Depending on your age and overall health, your health care provider may do certain tests to screen for prostate and testicular cancer.  Talk to your health care provider about any symptoms or concerns you have about testicular or prostate cancer. Skin Cancer  Check your skin from head to toe regularly.  Tell your health care provider about any new moles or changes in moles, especially if:  There is a change in a mole's size, shape, or color.  You have a mole that is larger than a pencil eraser.  Always use sunscreen. Apply sunscreen liberally and repeat throughout the day.  Protect yourself by wearing long sleeves, pants, a wide-brimmed hat, and sunglasses when outside. What should I know about heart disease, diabetes, and high blood pressure?  If you are 18-39 years of age, have your blood pressure checked every 3-5 years. If you are 40 years of age or older, have your blood pressure checked every year. You should have your blood pressure measured twice-once when you are at a hospital or clinic, and once when you are not at   a hospital or clinic. Record the average of the two measurements. To check your blood pressure when you are not at a hospital or clinic, you can use:  An automated blood pressure machine at a pharmacy.  A home blood pressure monitor.  Talk to your health care provider about your target blood pressure.  If you are between 45-79 years old, ask your health care provider if you should take aspirin to prevent heart disease.  Have regular diabetes screenings by checking your fasting blood sugar  level.  If you are at a normal weight and have a low risk for diabetes, have this test once every three years after the age of 45.  If you are overweight and have a high risk for diabetes, consider being tested at a younger age or more often.  A one-time screening for abdominal aortic aneurysm (AAA) by ultrasound is recommended for men aged 65-75 years who are current or former smokers. What should I know about preventing infection? Hepatitis B  If you have a higher risk for hepatitis B, you should be screened for this virus. Talk with your health care provider to find out if you are at risk for hepatitis B infection. Hepatitis C  Blood testing is recommended for:  Everyone born from 1945 through 1965.  Anyone with known risk factors for hepatitis C. Sexually Transmitted Diseases (STDs)  You should be screened each year for STDs including gonorrhea and chlamydia if:  You are sexually active and are younger than 49 years of age.  You are older than 49 years of age and your health care provider tells you that you are at risk for this type of infection.  Your sexual activity has changed since you were last screened and you are at an increased risk for chlamydia or gonorrhea. Ask your health care provider if you are at risk.  Talk with your health care provider about whether you are at high risk of being infected with HIV. Your health care provider may recommend a prescription medicine to help prevent HIV infection. What else can I do?  Schedule regular health, dental, and eye exams.  Stay current with your vaccines (immunizations).  Do not use any tobacco products, such as cigarettes, chewing tobacco, and e-cigarettes. If you need help quitting, ask your health care provider.  Limit alcohol intake to no more than 2 drinks per day. One drink equals 12 ounces of beer, 5 ounces of wine, or 1 ounces of hard liquor.  Do not use street drugs.  Do not share needles.  Ask your health  care provider for help if you need support or information about quitting drugs.  Tell your health care provider if you often feel depressed.  Tell your health care provider if you have ever been abused or do not feel safe at home. This information is not intended to replace advice given to you by your health care provider. Make sure you discuss any questions you have with your health care provider. Document Released: 02/27/2008 Document Revised: 04/29/2016 Document Reviewed: 06/04/2015 Elsevier Interactive Patient Education  2017 Elsevier Inc.  

## 2016-12-23 NOTE — Progress Notes (Signed)
Office Note 12/23/2016  CC:  Chief Complaint  Patient presents with  . Annual Exam    Pt is fasting.     HPI:  Vernon Mccormick is a 51 y.o. White male who is here for annual health maintenance exam.  Did not take his bp med today. No recent home bp monitoring.  Eye exam: annually  Dental: preventative visits UTD. Exercise: "not like I should". Diet: feels like he is eating better lately.   Past Medical History:  Diagnosis Date  . ALLERGIC RHINITIS 05/12/2007   Qualifier: Diagnosis of  By: Scherrie Gerlach    . Elevated transaminase level 2009-2013   suspect NASH (2017)-pt preferred to simply start statin and skip abd u/s.  Viral Hep serologies neg.  Marland Kitchen Hyperlipidemia    Atorva intolerant (myalgias)  . Hypertension   . Metabolic syndrome   . Obesity, Class II, BMI 35-39.9, with comorbidity   . Other malaise and fatigue 2011   Testosterone normal 2011  . Recurrent sinusitis 11/16/2011    Past Surgical History:  Procedure Laterality Date  . SHOULDER SURGERY  2013; 2016   Left rotator cuff and biceps tendon repair (s/p fall)-2013, left.  Right 2016.    Family History  Problem Relation Age of Onset  . Hypertension Father   . Hypertension Mother   . Alzheimer's disease Mother   . Celiac disease Mother   . Osteoporosis Mother   . Stroke Paternal Grandfather   . Heart disease Paternal Grandfather   . Hypertension Paternal Grandfather   . Diabetes Paternal Grandfather   . Kidney disease Paternal Grandfather     Social History   Social History  . Marital status: Married    Spouse name: Almyra Free  . Number of children: 2  . Years of education: HS   Occupational History  .  Other    [employer]   Social History Main Topics  . Smoking status: Never Smoker  . Smokeless tobacco: Former Systems developer    Types: Chew  . Alcohol use Yes     Comment: socially  . Drug use: No  . Sexual activity: Not on file   Other Topics Concern  . Not on file   Social History Narrative    Married, 2 kids.   Long time local resident, grad of [employer].     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   No Tob.  Rare/social ETOH.  No drugs.    Outpatient Medications Prior to Visit  Medication Sig Dispense Refill  . aspirin 81 MG tablet Take 81 mg by mouth daily.    . cetirizine (ZYRTEC) 10 MG tablet Take 1 tablet (10 mg total) by mouth daily. Reported on 12/10/2015 30 tablet 6  . ibuprofen (ADVIL,MOTRIN) 800 MG tablet Take by mouth.    Marland Kitchen lisinopril-hydrochlorothiazide (PRINZIDE,ZESTORETIC) 20-25 MG tablet Take 1 tablet by mouth daily. 30 tablet 11  . atorvastatin (LIPITOR) 40 MG tablet TAKE ONE TABLET BY MOUTH EVERY DAY (Patient not taking: Reported on 11/27/2016) 30 tablet 3  . cyanocobalamin 100 MCG tablet Take 100 mcg by mouth daily.    . predniSONE (DELTASONE) 20 MG tablet 2 tabs po qd x 5d (Patient not taking: Reported on 12/23/2016) 10 tablet 0   No facility-administered medications prior to visit.     No Known Allergies  ROS Review of Systems  Constitutional: Negative for appetite change, chills, fatigue and fever.  HENT: Negative for congestion, dental problem, ear pain and sore throat.  Eyes: Negative for discharge, redness and visual disturbance.  Respiratory: Negative for cough, chest tightness, shortness of breath and wheezing.   Cardiovascular: Negative for chest pain, palpitations and leg swelling.  Gastrointestinal: Negative for abdominal pain, blood in stool, diarrhea, nausea and vomiting.  Genitourinary: Negative for difficulty urinating, dysuria, flank pain, frequency, hematuria and urgency.  Musculoskeletal: Negative for arthralgias, back pain, joint swelling, myalgias and neck stiffness.  Skin: Negative for pallor and rash.  Neurological: Negative for dizziness, speech difficulty, weakness and headaches.  Hematological: Negative for adenopathy. Does not bruise/bleed easily.  Psychiatric/Behavioral: Negative for confusion and sleep  disturbance. The patient is not nervous/anxious.     PE; Blood pressure (!) 147/85, pulse 62, temperature 98.1 F (36.7 C), temperature source Oral, resp. rate 16, height 5' 11"  (1.803 m), weight 266 lb 8 oz (120.9 kg), SpO2 96 %. Body mass index is 37.17 kg/m.  Gen: Alert, well appearing.  Patient is oriented to person, place, time, and situation. AFFECT: pleasant, lucid thought and speech. ENT: Ears: EACs clear, normal epithelium.  TMs with good light reflex and landmarks bilaterally.  Eyes: no injection, icteris, swelling, or exudate.  EOMI, PERRLA. Nose: no drainage or turbinate edema/swelling.  No injection or focal lesion.  Mouth: lips without lesion/swelling.  Oral mucosa pink and moist.  Dentition intact and without obvious caries or gingival swelling.  Oropharynx without erythema, exudate, or swelling.  Neck: supple/nontender.  No LAD, mass, or TM.  Carotid pulses 2+ bilaterally, without bruits. CV: RRR, no m/r/g.   LUNGS: CTA bilat, nonlabored resps, good aeration in all lung fields. ABD: soft, NT, ND, BS normal.  No hepatospenomegaly or mass.  No bruits. EXT: no clubbing, cyanosis, or edema.  Musculoskeletal: no joint swelling, erythema, warmth, or tenderness.  ROM of all joints intact. Skin - no sores or suspicious lesions or rashes or color changes   Pertinent labs:  Lab Results  Component Value Date   TSH 2.73 12/10/2015   Lab Results  Component Value Date   WBC 8.2 12/10/2015   HGB 15.8 12/10/2015   HCT 47.9 12/10/2015   MCV 87.2 12/10/2015   PLT 250.0 12/10/2015   Lab Results  Component Value Date   CREATININE 1.00 06/16/2016   BUN 17 06/16/2016   NA 137 06/16/2016   K 3.7 06/16/2016   CL 99 06/16/2016   CO2 29 06/16/2016   Lab Results  Component Value Date   ALT 45 06/16/2016   AST 29 06/16/2016   ALKPHOS 72 06/16/2016   BILITOT 0.6 06/16/2016   Lab Results  Component Value Date   CHOL 196 06/16/2016   Lab Results  Component Value Date   HDL  38.70 (L) 06/16/2016   Lab Results  Component Value Date   LDLCALC 126 (H) 06/16/2016   Lab Results  Component Value Date   TRIG 159.0 (H) 06/16/2016   Lab Results  Component Value Date   CHOLHDL 5 06/16/2016    ASSESSMENT AND PLAN:   Health maintenance exam: Reviewed age and gender appropriate health maintenance issues (prudent diet, regular exercise, health risks of tobacco and excessive alcohol, use of seatbelts, fire alarms in home, use of sunscreen).  Also reviewed age and gender appropriate health screening as well as vaccine recommendations. Vaccines UTD: he also reports he recently got tetanus booster, Hep A vaccine, MMR booster, and is currently on oral typhoid vaccine--these were given in advance of a medical missions trip he is going on soon to [place]. Fasting HP labs today.  An After Visit Summary was printed and given to the patient.  FOLLOW UP:  Return in about 6 months (around 06/24/2017) for routine chronic illness f/u.  Signed:  Crissie Sickles, MD           12/23/2016

## 2016-12-23 NOTE — Progress Notes (Signed)
Pre visit review using our clinic review tool, if applicable. No additional management support is needed unless otherwise documented below in the visit note. 

## 2017-06-25 ENCOUNTER — Ambulatory Visit: Payer: Self-pay | Admitting: Family Medicine

## 2017-07-05 ENCOUNTER — Encounter: Payer: Self-pay | Admitting: Family Medicine

## 2017-07-05 ENCOUNTER — Ambulatory Visit (INDEPENDENT_AMBULATORY_CARE_PROVIDER_SITE_OTHER): Payer: Managed Care, Other (non HMO) | Admitting: Family Medicine

## 2017-07-05 VITALS — BP 130/78 | HR 61 | Temp 97.8°F | Resp 16 | Ht 70.5 in | Wt 261.8 lb

## 2017-07-05 DIAGNOSIS — K7581 Nonalcoholic steatohepatitis (NASH): Secondary | ICD-10-CM

## 2017-07-05 DIAGNOSIS — E7800 Pure hypercholesterolemia, unspecified: Secondary | ICD-10-CM | POA: Diagnosis not present

## 2017-07-05 DIAGNOSIS — I10 Essential (primary) hypertension: Secondary | ICD-10-CM

## 2017-07-05 DIAGNOSIS — Z23 Encounter for immunization: Secondary | ICD-10-CM

## 2017-07-05 LAB — COMPREHENSIVE METABOLIC PANEL
ALBUMIN: 4.3 g/dL (ref 3.5–5.2)
ALK PHOS: 62 U/L (ref 39–117)
ALT: 50 U/L (ref 0–53)
AST: 31 U/L (ref 0–37)
BUN: 17 mg/dL (ref 6–23)
CHLORIDE: 102 meq/L (ref 96–112)
CO2: 25 mEq/L (ref 19–32)
CREATININE: 0.9 mg/dL (ref 0.40–1.50)
Calcium: 9.2 mg/dL (ref 8.4–10.5)
GFR: 95.37 mL/min (ref >60.00)
Glucose, Bld: 100 mg/dL — ABNORMAL HIGH (ref 70–99)
POTASSIUM: 3.9 meq/L (ref 3.5–5.1)
SODIUM: 136 meq/L (ref 135–145)
TOTAL PROTEIN: 6.6 g/dL (ref 6.0–8.3)
Total Bilirubin: 0.7 mg/dL (ref 0.2–1.2)

## 2017-07-05 LAB — LIPID PANEL
Cholesterol: 202 mg/dL — ABNORMAL HIGH (ref 0–200)
HDL: 39.1 mg/dL (ref >39.00)
LDL CALC: 134 mg/dL — AB (ref 0–99)
NONHDL: 162.47
Total CHOL/HDL Ratio: 5
Triglycerides: 144 mg/dL (ref 0.0–149.0)
VLDL: 28.8 mg/dL (ref 0.0–40.0)

## 2017-07-05 NOTE — Progress Notes (Signed)
OFFICE VISIT  07/05/2017   CC:  Chief Complaint  Patient presents with  . Follow-up    RCI, is fasting.    HPI:    Patient is a 51 y.o. Caucasian male who presents for 6 mo f/u HLD, HTN, metabolic syndrome, NASH. Trying to eat much better last few months, trying to avoid fried foods and eat less fat and more fresh fruits and veggies and drink more water: feels better.  Exercise part of things still lacking.  HTN: checks 1 time q2-3 weeks, consistently 130/80 or less.    NASH/HLD: he is open to trial of different statin if necessary.  ROS: no abd pain, no jaundice, no n/v, no melena, no CP, no SOB, no palpitations.   Past Medical History:  Diagnosis Date  . ALLERGIC RHINITIS 05/12/2007   Qualifier: Diagnosis of  By: Scherrie Gerlach    . Elevated transaminase level 2009-2013   suspect NASH (2017)-pt preferred to simply start statin and skip abd u/s.  Viral Hep serologies neg.  Marland Kitchen Hyperlipidemia    Atorva intolerant (myalgias)  . Hypertension   . Metabolic syndrome   . Obesity, Class II, BMI 35-39.9, with comorbidity   . Other malaise and fatigue 2011   Testosterone normal 2011  . Recurrent sinusitis 11/16/2011    Past Surgical History:  Procedure Laterality Date  . SHOULDER SURGERY  2013; 2016   Left rotator cuff and biceps tendon repair (s/p fall)-2013, left.  Right 2016.    Outpatient Medications Prior to Visit  Medication Sig Dispense Refill  . aspirin 81 MG tablet Take 81 mg by mouth daily.    . cetirizine (ZYRTEC) 10 MG tablet Take 1 tablet (10 mg total) by mouth daily. Reported on 12/10/2015 30 tablet 6  . ibuprofen (ADVIL,MOTRIN) 800 MG tablet Take by mouth.    Marland Kitchen lisinopril-hydrochlorothiazide (PRINZIDE,ZESTORETIC) 20-25 MG tablet Take 1 tablet by mouth daily. 30 tablet 11   No facility-administered medications prior to visit.     No Known Allergies  ROS As per HPI  PE: Blood pressure 130/78, pulse 61, temperature 97.8 F (36.6 C), temperature source Oral,  resp. rate 16, height 5' 10.5" (1.791 m), weight 261 lb 12 oz (118.7 kg), SpO2 97 %. Gen: Alert, well appearing.  Patient is oriented to person, place, time, and situation. AFFECT: pleasant, lucid thought and speech. No further exam today.  LABS:  Lab Results  Component Value Date   TSH 2.42 12/23/2016   Lab Results  Component Value Date   WBC 6.5 12/23/2016   HGB 15.8 12/23/2016   HCT 46.4 12/23/2016   MCV 85.4 12/23/2016   PLT 225.0 12/23/2016   Lab Results  Component Value Date   CREATININE 1.02 12/23/2016   BUN 15 12/23/2016   NA 136 12/23/2016   K 3.7 12/23/2016   CL 99 12/23/2016   CO2 27 12/23/2016   Lab Results  Component Value Date   ALT 57 (H) 12/23/2016   AST 33 12/23/2016   ALKPHOS 72 12/23/2016   BILITOT 0.5 12/23/2016   Lab Results  Component Value Date   CHOL 195 12/23/2016   Lab Results  Component Value Date   HDL 32.80 (L) 12/23/2016   Lab Results  Component Value Date   LDLCALC 127 (H) 12/23/2016   Lab Results  Component Value Date   TRIG 179.0 (H) 12/23/2016   Lab Results  Component Value Date   CHOLHDL 6 12/23/2016   IMPRESSION AND PLAN:  1) HTN: The current  medical regimen is effective;  continue present plan and medications. Lytes/Cr today.  2) NASH, w/ hyperlipidemia: pt willing to try diff statin (intol to atorva) if necessary. Continue TLC. FLP and hepatic panel today.  An After Visit Summary was printed and given to the patient.  FOLLOW UP: Return in about 6 months (around 01/03/2018) for annual CPE (fasting).  Signed:  Crissie Sickles, MD           07/05/2017

## 2017-07-07 ENCOUNTER — Encounter: Payer: Self-pay | Admitting: Family Medicine

## 2017-07-07 ENCOUNTER — Other Ambulatory Visit: Payer: Self-pay | Admitting: Family Medicine

## 2017-07-07 MED ORDER — ROSUVASTATIN CALCIUM 10 MG PO TABS: 10.0000 mg | ORAL_TABLET | Freq: Every day | ORAL | 2 refills | Status: DC

## 2017-07-08 ENCOUNTER — Other Ambulatory Visit: Payer: Self-pay | Admitting: *Deleted

## 2017-07-08 DIAGNOSIS — E785 Hyperlipidemia, unspecified: Secondary | ICD-10-CM

## 2017-12-21 ENCOUNTER — Other Ambulatory Visit: Payer: Self-pay | Admitting: Family Medicine

## 2017-12-28 ENCOUNTER — Ambulatory Visit (INDEPENDENT_AMBULATORY_CARE_PROVIDER_SITE_OTHER): Payer: 59 | Admitting: Family Medicine

## 2017-12-28 ENCOUNTER — Encounter: Payer: Self-pay | Admitting: Family Medicine

## 2017-12-28 VITALS — BP 134/92 | HR 77 | Temp 98.3°F | Resp 16 | Ht 71.0 in | Wt 263.0 lb

## 2017-12-28 DIAGNOSIS — E7800 Pure hypercholesterolemia, unspecified: Secondary | ICD-10-CM | POA: Diagnosis not present

## 2017-12-28 DIAGNOSIS — I10 Essential (primary) hypertension: Secondary | ICD-10-CM | POA: Diagnosis not present

## 2017-12-28 DIAGNOSIS — Z0000 Encounter for general adult medical examination without abnormal findings: Secondary | ICD-10-CM

## 2017-12-28 LAB — CBC WITH DIFFERENTIAL/PLATELET
Basophils Absolute: 0.1 10*3/uL (ref 0.0–0.1)
Basophils Relative: 1 % (ref 0.0–3.0)
EOS PCT: 1.6 % (ref 0.0–5.0)
Eosinophils Absolute: 0.1 10*3/uL (ref 0.0–0.7)
HCT: 47.3 % (ref 39.0–52.0)
Hemoglobin: 16.3 g/dL (ref 13.0–17.0)
LYMPHS ABS: 2.5 10*3/uL (ref 0.7–4.0)
Lymphocytes Relative: 31.7 % (ref 12.0–46.0)
MCHC: 34.5 g/dL (ref 30.0–36.0)
MCV: 86.3 fl (ref 78.0–100.0)
MONOS PCT: 6.2 % (ref 3.0–12.0)
Monocytes Absolute: 0.5 10*3/uL (ref 0.1–1.0)
NEUTROS ABS: 4.6 10*3/uL (ref 1.4–7.7)
NEUTROS PCT: 59.5 % (ref 43.0–77.0)
Platelets: 245 10*3/uL (ref 150.0–400.0)
RBC: 5.49 Mil/uL (ref 4.22–5.81)
RDW: 13.8 % (ref 11.5–15.5)
WBC: 7.8 10*3/uL (ref 4.0–10.5)

## 2017-12-28 LAB — LIPID PANEL
CHOLESTEROL: 144 mg/dL (ref 0–200)
HDL: 41.2 mg/dL (ref >39.00)
LDL Cholesterol: 80 mg/dL (ref 0–99)
NONHDL: 102.32
TRIGLYCERIDES: 113 mg/dL (ref 0.0–149.0)
Total CHOL/HDL Ratio: 3
VLDL: 22.6 mg/dL (ref 0.0–40.0)

## 2017-12-28 LAB — COMPREHENSIVE METABOLIC PANEL
ALK PHOS: 57 U/L (ref 39–117)
ALT: 47 U/L (ref 0–53)
AST: 27 U/L (ref 0–37)
Albumin: 4.3 g/dL (ref 3.5–5.2)
BUN: 16 mg/dL (ref 6–23)
CO2: 29 mEq/L (ref 19–32)
Calcium: 9.5 mg/dL (ref 8.4–10.5)
Chloride: 100 mEq/L (ref 96–112)
Creatinine, Ser: 0.99 mg/dL (ref 0.40–1.50)
GFR: 85.27 mL/min (ref >60.00)
GLUCOSE: 89 mg/dL (ref 70–99)
POTASSIUM: 4.1 meq/L (ref 3.5–5.1)
Sodium: 137 mEq/L (ref 135–145)
Total Bilirubin: 0.5 mg/dL (ref 0.2–1.2)
Total Protein: 6.6 g/dL (ref 6.0–8.3)

## 2017-12-28 LAB — TSH: TSH: 2.03 u[IU]/mL (ref 0.35–4.50)

## 2017-12-28 NOTE — Patient Instructions (Signed)

## 2017-12-28 NOTE — Progress Notes (Signed)
OFFICE VISIT  12/28/2017   CC:  Chief Complaint  Patient presents with  . Annual Exam   HPI:    Patient is a 52 y.o. Caucasian male who presents for annual health maintenance exam.  Did not take his med today for bp. Lots of stress lately, wife has rare lung dz (PVOD--pulmonary vaso-occlusive dz--but some experts she sees are still trying to make exact dx) and is on waiting list for lung transplant. He does monitor bp at home--well controlled.  Exercise: none. Diet: not working on anything right now.   Past Medical History:  Diagnosis Date  . ALLERGIC RHINITIS 05/12/2007   Qualifier: Diagnosis of  By: Scherrie Gerlach    . Elevated transaminase level 2009-2013   suspect NASH (2017)-pt preferred to simply start statin and skip abd u/s.  Viral Hep serologies neg.  Marland Kitchen Hyperlipidemia    Atorva intolerant (myalgias).  Rosuva 46m started 06/2017.  .Marland KitchenHypertension   . Metabolic syndrome   . Obesity, Class II, BMI 35-39.9, with comorbidity   . Other malaise and fatigue 2011   Testosterone normal 2011  . Recurrent sinusitis 11/16/2011    Past Surgical History:  Procedure Laterality Date  . SHOULDER SURGERY  2013; 2016   Left rotator cuff and biceps tendon repair (s/p fall)-2013, left.  Right 2016.   Social History   Socioeconomic History  . Marital status: Married    Spouse name: JAlmyra Free . Number of children: 2  . Years of education: HS  . Highest education level: Not on file  Occupational History    Employer: OTHER    Comment: [employer]  Social Needs  . Financial resource strain: Not on file  . Food insecurity:    Worry: Not on file    Inability: Not on file  . Transportation needs:    Medical: Not on file    Non-medical: Not on file  Tobacco Use  . Smoking status: Never Smoker  . Smokeless tobacco: Former USystems developer   Types: Chew  Substance and Sexual Activity  . Alcohol use: Yes    Comment: socially  . Drug use: No  . Sexual activity: Not on file  Lifestyle  .  Physical activity:    Days per week: Not on file    Minutes per session: Not on file  . Stress: Not on file  Relationships  . Social connections:    Talks on phone: Not on file    Gets together: Not on file    Attends religious service: Not on file    Active member of club or organization: Not on file    Attends meetings of clubs or organizations: Not on file    Relationship status: Not on file  Other Topics Concern  . Not on file  Social History Narrative   Married, 2 kids.   Long time local resident, grad of [employer]     Self employed: sells parts to [employer]   No Tob.  Rare/social ETOH.  No drugs.   Family History  Problem Relation Age of Onset  . Hypertension Father   . Hypertension Mother   . Alzheimer's disease Mother   . Celiac disease Mother   . Osteoporosis Mother   . Stroke Paternal Grandfather   . Heart disease Paternal Grandfather   . Hypertension Paternal Grandfather   . Diabetes Paternal Grandfather   . Kidney disease Paternal Grandfather     Outpatient Medications Prior to Visit  Medication Sig Dispense Refill  .  aspirin 81 MG tablet Take 81 mg by mouth daily.    . cetirizine (ZYRTEC) 10 MG tablet Take 1 tablet (10 mg total) by mouth daily. Reported on 12/10/2015 30 tablet 6  . ibuprofen (ADVIL,MOTRIN) 800 MG tablet Take by mouth.    Marland Kitchen lisinopril-hydrochlorothiazide (PRINZIDE,ZESTORETIC) 20-25 MG tablet Take 1 tablet by mouth daily. 30 tablet 11  . Multiple Vitamin (MULTIVITAMIN) tablet Take 1 tablet by mouth daily.    . rosuvastatin (CRESTOR) 10 MG tablet TAKE ONE TABLET BY MOUTH EVERY DAY 30 tablet 1   No facility-administered medications prior to visit.     No Known Allergies  ROS Review of Systems  Constitutional: Negative for appetite change, chills, fatigue and fever.  HENT: Negative for congestion, dental problem, ear pain and sore throat.   Eyes: Negative for discharge, redness and visual disturbance.  Respiratory: Negative  for cough, chest tightness, shortness of breath and wheezing.   Cardiovascular: Negative for chest pain, palpitations and leg swelling.  Gastrointestinal: Negative for abdominal pain, blood in stool, diarrhea, nausea and vomiting.  Genitourinary: Negative for difficulty urinating, dysuria, flank pain, frequency, hematuria and urgency.  Musculoskeletal: Negative for arthralgias, back pain, joint swelling, myalgias and neck stiffness.  Skin: Negative for pallor and rash.  Neurological: Negative for dizziness, speech difficulty, weakness and headaches.  Hematological: Negative for adenopathy. Does not bruise/bleed easily.  Psychiatric/Behavioral: Negative for confusion and sleep disturbance. The patient is not nervous/anxious.      PE: Blood pressure (!) 134/92, pulse 77, temperature 98.3 F (36.8 C), temperature source Oral, resp. rate 16, height 5' 11"  (1.803 m), weight 263 lb (119.3 kg), SpO2 97 %. Body mass index is 36.68 kg/m.  Gen: Alert, well appearing.  Patient is oriented to person, place, time, and situation. AFFECT: pleasant, lucid thought and speech. ENT: Ears: EACs clear, normal epithelium.  TMs with good light reflex and landmarks bilaterally.  Eyes: no injection, icteris, swelling, or exudate.  EOMI, PERRLA. Nose: no drainage or turbinate edema/swelling.  No injection or focal lesion.  Mouth: lips without lesion/swelling.  Oral mucosa pink and moist.  Dentition intact and without obvious caries or gingival swelling.  Oropharynx without erythema, exudate, or swelling.  Neck: supple/nontender.  No LAD, mass, or TM.  Carotid pulses 2+ bilaterally, without bruits. CV: RRR, no m/r/g.   LUNGS: CTA bilat, nonlabored resps, good aeration in all lung fields. ABD: soft, NT, ND, BS normal.  No hepatospenomegaly or mass.  No bruits. EXT: no clubbing, cyanosis, or edema.  Musculoskeletal: no joint swelling, erythema, warmth, or tenderness.  ROM of all joints intact. Skin - no sores or  suspicious lesions or rashes or color changes   LABS:  Lab Results  Component Value Date   TSH 2.42 12/23/2016   Lab Results  Component Value Date   WBC 6.5 12/23/2016   HGB 15.8 12/23/2016   HCT 46.4 12/23/2016   MCV 85.4 12/23/2016   PLT 225.0 12/23/2016   Lab Results  Component Value Date   CREATININE 0.90 07/05/2017   BUN 17 07/05/2017   NA 136 07/05/2017   K 3.9 07/05/2017   CL 102 07/05/2017   CO2 25 07/05/2017   Lab Results  Component Value Date   ALT 50 07/05/2017   AST 31 07/05/2017   ALKPHOS 62 07/05/2017   BILITOT 0.7 07/05/2017   Lab Results  Component Value Date   CHOL 202 (H) 07/05/2017   Lab Results  Component Value Date   HDL 39.10 07/05/2017  Lab Results  Component Value Date   LDLCALC 134 (H) 07/05/2017   Lab Results  Component Value Date   TRIG 144.0 07/05/2017   Lab Results  Component Value Date   CHOLHDL 5 07/05/2017     IMPRESSION AND PLAN:  Health maintenance exam: Reviewed age and gender appropriate health maintenance issues (prudent diet, regular exercise, health risks of tobacco and excessive alcohol, use of seatbelts, fire alarms in home, use of sunscreen).  Also reviewed age and gender appropriate health screening as well as vaccine recommendations. Vaccines: UTD. Labs: fasting HP today. Prostate ca screening: average risk patient= start screening at age 7 yrs. Colon ca screening: average risk patient= start screening at age 81 yrs.  An After Visit Summary was printed and given to the patient.  FOLLOW UP: Return in about 6 months (around 06/29/2018) for routine chronic illness f/u.  Signed:  Crissie Sickles, MD           12/28/2017

## 2018-01-27 ENCOUNTER — Other Ambulatory Visit: Payer: Self-pay | Admitting: Family Medicine

## 2018-01-27 ENCOUNTER — Ambulatory Visit (INDEPENDENT_AMBULATORY_CARE_PROVIDER_SITE_OTHER): Payer: 59 | Admitting: Family Medicine

## 2018-01-27 ENCOUNTER — Telehealth: Payer: Self-pay | Admitting: Family Medicine

## 2018-01-27 ENCOUNTER — Encounter: Payer: Self-pay | Admitting: Family Medicine

## 2018-01-27 VITALS — BP 136/94 | HR 75 | Temp 97.9°F | Resp 20 | Ht 71.0 in | Wt 263.0 lb

## 2018-01-27 DIAGNOSIS — J0100 Acute maxillary sinusitis, unspecified: Secondary | ICD-10-CM | POA: Diagnosis not present

## 2018-01-27 MED ORDER — AMOXICILLIN-POT CLAVULANATE 875-125 MG PO TABS: 1.0000 | ORAL_TABLET | Freq: Two times a day (BID) | ORAL | 0 refills | Status: DC

## 2018-01-27 MED ORDER — BENZONATATE 200 MG PO CAPS: 200.0000 mg | ORAL_CAPSULE | Freq: Three times a day (TID) | ORAL | 0 refills | Status: DC | PRN

## 2018-01-27 NOTE — Telephone Encounter (Signed)
Copied from [place] [phone]. Topic: Quick Communication - See Telephone Encounter >> Jan 27, 2018  8:36 AM Vernon Mccormick wrote: CRM for notification. See Telephone encounter for: 01/27/18.  PT is asking for call from DR McGowens nurse,  he thinks he may have a sinus infection and wants to see about getting antibiotic

## 2018-01-27 NOTE — Patient Instructions (Signed)
Rest, hydrate.  + flonase, mucinex (DM if cough), nettie pot or nasal saline.  Augmentin every 12 hours with food prescribed, take until completed.  Tessalon perles for cough as needed every 8 hours.  If cough present it can last up to 6-8 weeks.  F/U 2 weeks of not improved.     Sinusitis, Adult Sinusitis is soreness and inflammation of your sinuses. Sinuses are hollow spaces in the bones around your face. They are located:  Around your eyes.  In the middle of your forehead.  Behind your nose.  In your cheekbones.  Your sinuses and nasal passages are lined with a stringy fluid (mucus). Mucus normally drains out of your sinuses. When your nasal tissues get inflamed or swollen, the mucus can get trapped or blocked so air cannot flow through your sinuses. This lets bacteria, viruses, and funguses grow, and that leads to infection. Follow these instructions at home: Medicines  Take, use, or apply over-the-counter and prescription medicines only as told by your doctor. These may include nasal sprays.  If you were prescribed an antibiotic medicine, take it as told by your doctor. Do not stop taking the antibiotic even if you start to feel better. Hydrate and Humidify  Drink enough water to keep your pee (urine) clear or pale yellow.  Use a cool mist humidifier to keep the humidity level in your home above 50%.  Breathe in steam for 10-15 minutes, 3-4 times a day or as told by your doctor. You can do this in the bathroom while a hot shower is running.  Try not to spend time in cool or dry air. Rest  Rest as much as possible.  Sleep with your head raised (elevated).  Make sure to get enough sleep each night. General instructions  Put a warm, moist washcloth on your face 3-4 times a day or as told by your doctor. This will help with discomfort.  Wash your hands often with soap and water. If there is no soap and water, use hand sanitizer.  Do not smoke. Avoid being around  people who are smoking (secondhand smoke).  Keep all follow-up visits as told by your doctor. This is important. Contact a doctor if:  You have a fever.  Your symptoms get worse.  Your symptoms do not get better within 10 days. Get help right away if:  You have a very bad headache.  You cannot stop throwing up (vomiting).  You have pain or swelling around your face or eyes.  You have trouble seeing.  You feel confused.  Your neck is stiff.  You have trouble breathing. This information is not intended to replace advice given to you by your health care provider. Make sure you discuss any questions you have with your health care provider. Document Released: 02/17/2008 Document Revised: 04/26/2016 Document Reviewed: 06/26/2015 Elsevier Interactive Patient Education  [employer].

## 2018-01-27 NOTE — Telephone Encounter (Signed)
SW pt and advised him that Dr. Anitra Lauth is out of the office for the next 3 business days and Dr. Raoul Pitch requires an office visit for treatment. Pt voiced understanding and asked if I could talk to Dr. Raoul Pitch and see if she would be will to send something in b/c his wife just had a lung transplant 12/29/17 and he doesn't want to get her sick. I advised pt that Dr. Raoul Pitch will need to see him to determine if an antibiotic is the right course of treatment. Pt voiced understanding and agreed to schedule apt. Apt made for today (01/27/18) at 10:30am.

## 2018-01-27 NOTE — Progress Notes (Signed)
Vernon Mccormick , 11/09/1965, 52 y.o., male MRN: [id number] Patient Care Team    Relationship Specialty Notifications Start End  McGowen, Adrian Blackwater, MD PCP - General Family Medicine  09/03/11     Chief Complaint  Patient presents with  . URI    facial pressure,drainage,cough x 2 weeks     Subjective: Pt presents for an OV with complaints of cough of 2 weeks duration.  Associated symptoms include facial pressure, cough and drainage. He denies fever, chills, nausea, vomiting or diarrhea.  He reports he had a mild cough and felt he was getting a cold about 3 weeks ago, mildly improved, then symptoms reoccurred more severely over the past 2 weeks. His wife just had a double lung transplant a few weeks ago for PVOD.  Depression screen [facility] 2/9 12/28/2017  Decreased Interest 0  Down, Depressed, Hopeless 0  PHQ - 2 Score 0  Altered sleeping 3  Tired, decreased energy 2  Change in appetite 0  Feeling bad or failure about yourself  0  Trouble concentrating 2  Moving slowly or fidgety/restless 0  Suicidal thoughts 0  PHQ-9 Score 7  Difficult doing work/chores Not difficult at all    No Known Allergies Social History   Tobacco Use  . Smoking status: Never Smoker  . Smokeless tobacco: Former Systems developer    Types: Chew  Substance Use Topics  . Alcohol use: Yes    Comment: socially   Past Medical History:  Diagnosis Date  . ALLERGIC RHINITIS 05/12/2007   Qualifier: Diagnosis of  By: Scherrie Gerlach    . Elevated transaminase level 2009-2013   suspect NASH (2017)-pt preferred to simply start statin and skip abd u/s.  Viral Hep serologies neg.  Marland Kitchen Hyperlipidemia    Atorva intolerant (myalgias).  Rosuva 63m started 06/2017.  .Marland KitchenHypertension   . Metabolic syndrome   . Obesity, Class II, BMI 35-39.9, with comorbidity   . Other malaise and fatigue 2011   Testosterone normal 2011  . Recurrent sinusitis 11/16/2011   Past Surgical History:  Procedure Laterality Date  . SHOULDER SURGERY   2013; 2016   Left rotator cuff and biceps tendon repair (s/p fall)-2013, left.  Right 2016.   Family History  Problem Relation Age of Onset  . Hypertension Father   . Hypertension Mother   . Alzheimer's disease Mother   . Celiac disease Mother   . Osteoporosis Mother   . Stroke Paternal Grandfather   . Heart disease Paternal Grandfather   . Hypertension Paternal Grandfather   . Diabetes Paternal Grandfather   . Kidney disease Paternal Grandfather    Allergies as of 01/27/2018   No Known Allergies     Medication List        Accurate as of 01/27/18 10:37 AM. Always use your most recent med list.          aspirin 81 MG tablet Take 81 mg by mouth daily.   cetirizine 10 MG tablet Commonly known as:  ZYRTEC Take 1 tablet (10 mg total) by mouth daily. Reported on 12/10/2015   ibuprofen 800 MG tablet Commonly known as:  ADVIL,MOTRIN Take by mouth.   lisinopril-hydrochlorothiazide 20-25 MG tablet Commonly known as:  PRINZIDE,ZESTORETIC Take 1 tablet by mouth daily.   multivitamin tablet Take 1 tablet by mouth daily.   rosuvastatin 10 MG tablet Commonly known as:  CRESTOR TAKE ONE TABLET BY MOUTH EVERY DAY       All past medical history, surgical history,  allergies, family history, immunizations andmedications were updated in the EMR today and reviewed under the history and medication portions of their EMR.     ROS: Negative, with the exception of above mentioned in HPI  Objective:  BP (!) 136/94 (BP Location: Right Arm, Patient Position: Sitting, Cuff Size: Large)   Pulse 75   Temp 97.9 F (36.6 C)   Resp 20   Ht 5' 11"  (1.803 m)   Wt 263 lb (119.3 kg)   SpO2 96%   BMI 36.68 kg/m  Body mass index is 36.68 kg/m. Gen: Afebrile. No acute distress. Nontoxic in appearance, well developed, well nourished.  HENT: AT. [place]. Bilateral TM visualized with fullness and air fluid levels. . MMM, no oral lesions. Bilateral nares with erythema and drainage. Throat without  erythema or exudates. Mild cough. PND present. TTP sinus.  Eyes:Pupils Equal Round Reactive to light, Extraocular movements intact,  Conjunctiva without redness, discharge or icterus. Neck/lymp/endocrine: Supple,no lymphadenopathy CV: RRR  Chest: CTAB, no wheeze or crackles. Good air movement, normal resp effort.  Neuro:  Normal gait. PERLA. EOMi. Alert. Oriented x3 No exam data present No results found. No results found for this or any previous visit (from the past 24 hour(s)).  Assessment/Plan: Vernon Mccormick is a 52 y.o. male present for OV for  Acute non-recurrent maxillary sinusitis Rest, hydrate.  + flonase, mucinex (DM if cough), nettie pot or nasal saline.  Augmentin every 12 hours with food prescribed, take until completed.  Tessalon perles for cough as needed every 8 hours.  If cough present it can last up to 6-8 weeks.  F/U 2 weeks of not improved.    Reviewed expectations re: course of current medical issues.  Discussed self-management of symptoms.  Outlined signs and symptoms indicating need for more acute intervention.  Patient verbalized understanding and all questions were answered.  Patient received an After-Visit Summary.    No orders of the defined types were placed in this encounter.    Note is dictated utilizing voice recognition software. Although note has been proof read prior to signing, occasional typographical errors still can be missed. If any questions arise, please do not hesitate to call for verification.   electronically signed by:  Howard Pouch, DO  [place]

## 2018-02-28 ENCOUNTER — Other Ambulatory Visit: Payer: Self-pay | Admitting: Family Medicine

## 2018-06-28 ENCOUNTER — Ambulatory Visit (INDEPENDENT_AMBULATORY_CARE_PROVIDER_SITE_OTHER): Payer: 59 | Admitting: Family Medicine

## 2018-06-28 ENCOUNTER — Encounter: Payer: Self-pay | Admitting: Family Medicine

## 2018-06-28 VITALS — BP 129/80 | HR 65 | Temp 98.2°F | Resp 16 | Ht 71.0 in | Wt 262.4 lb

## 2018-06-28 DIAGNOSIS — E7800 Pure hypercholesterolemia, unspecified: Secondary | ICD-10-CM

## 2018-06-28 DIAGNOSIS — E669 Obesity, unspecified: Secondary | ICD-10-CM

## 2018-06-28 DIAGNOSIS — I10 Essential (primary) hypertension: Secondary | ICD-10-CM

## 2018-06-28 DIAGNOSIS — K7581 Nonalcoholic steatohepatitis (NASH): Secondary | ICD-10-CM

## 2018-06-28 LAB — COMPREHENSIVE METABOLIC PANEL
ALBUMIN: 4.4 g/dL (ref 3.5–5.2)
ALT: 43 U/L (ref 0–53)
AST: 27 U/L (ref 0–37)
Alkaline Phosphatase: 67 U/L (ref 39–117)
BUN: 15 mg/dL (ref 6–23)
CALCIUM: 9.6 mg/dL (ref 8.4–10.5)
CO2: 28 mEq/L (ref 19–32)
Chloride: 101 mEq/L (ref 96–112)
Creatinine, Ser: 0.96 mg/dL (ref 0.40–1.50)
GFR: 88.17 mL/min (ref >60.00)
Glucose, Bld: 101 mg/dL — ABNORMAL HIGH (ref 70–99)
POTASSIUM: 4 meq/L (ref 3.5–5.1)
SODIUM: 136 meq/L (ref 135–145)
Total Bilirubin: 0.5 mg/dL (ref 0.2–1.2)
Total Protein: 6.7 g/dL (ref 6.0–8.3)

## 2018-06-28 LAB — LIPID PANEL
CHOLESTEROL: 136 mg/dL (ref 0–200)
HDL: 36.7 mg/dL — ABNORMAL LOW (ref >39.00)
LDL CALC: 75 mg/dL (ref 0–99)
NonHDL: 99.5
Total CHOL/HDL Ratio: 4
Triglycerides: 122 mg/dL (ref 0.0–149.0)
VLDL: 24.4 mg/dL (ref 0.0–40.0)

## 2018-06-28 MED ORDER — CETIRIZINE HCL 10 MG PO TABS: 10.0000 mg | ORAL_TABLET | Freq: Every day | ORAL | 3 refills | Status: DC

## 2018-06-28 MED ORDER — ROSUVASTATIN CALCIUM 10 MG PO TABS: 10.0000 mg | ORAL_TABLET | Freq: Every day | ORAL | 3 refills | Status: DC

## 2018-06-28 MED ORDER — LISINOPRIL-HYDROCHLOROTHIAZIDE 20-25 MG PO TABS: 1.0000 | ORAL_TABLET | Freq: Every day | ORAL | 3 refills | Status: DC

## 2018-06-28 NOTE — Progress Notes (Signed)
OFFICE VISIT  06/28/2018   CC:  Chief Complaint  Patient presents with  . Follow-up    Pt is fasting.     HPI:    Patient is a 52 y.o. Caucasian male who presents for 6 mo f/u HTN, HLD, NASH.  Doing well.  Checks bp occasionally: 130/80 or better each time.  HLD: tolerating statin. Trying to eat less greasy/fatty food.   Exercise: walking a lot at work, otherwise none.  ROS: no CP, no SOB, no wheezing, no cough, no dizziness, no HAs, no rashes, no melena/hematochezia.  No polyuria or polydipsia.  No myalgias or arthralgias.   Past Medical History:  Diagnosis Date  . ALLERGIC RHINITIS 05/12/2007   Qualifier: Diagnosis of  By: Scherrie Gerlach    . Elevated transaminase level 2009-2013   suspect NASH (2017)-pt preferred to simply start statin and skip abd u/s.  Viral Hep serologies neg.  Marland Kitchen Hyperlipidemia    Atorva intolerant (myalgias).  Rosuva 39m started 06/2017.  .Marland KitchenHypertension   . Metabolic syndrome   . Obesity, Class II, BMI 35-39.9, with comorbidity   . Other malaise and fatigue 2011   Testosterone normal 2011  . Recurrent sinusitis 11/16/2011    Past Surgical History:  Procedure Laterality Date  . SHOULDER SURGERY  2013; 2016   Left rotator cuff and biceps tendon repair (s/p fall)-2013, left.  Right 2016.    Outpatient Medications Prior to Visit  Medication Sig Dispense Refill  . aspirin 81 MG tablet Take 81 mg by mouth daily.    .Marland Kitchenibuprofen (ADVIL,MOTRIN) 800 MG tablet Take by mouth.    . Multiple Vitamin (MULTIVITAMIN) tablet Take 1 tablet by mouth daily.    . cetirizine (ZYRTEC) 10 MG tablet Take 1 tablet (10 mg total) by mouth daily. Reported on 12/10/2015 30 tablet 6  . lisinopril-hydrochlorothiazide (PRINZIDE,ZESTORETIC) 20-25 MG tablet TAKE ONE TABLET BY MOUTH EVERY DAY 30 tablet 5  . rosuvastatin (CRESTOR) 10 MG tablet TAKE ONE TABLET BY MOUTH EVERY DAY 30 tablet 5  . amoxicillin-clavulanate (AUGMENTIN) 875-125 MG tablet Take 1 tablet by mouth 2 (two)  times daily. (Patient not taking: Reported on 06/28/2018) 20 tablet 0  . benzonatate (TESSALON) 200 MG capsule Take 1 capsule (200 mg total) by mouth 3 (three) times daily as needed for cough. (Patient not taking: Reported on 06/28/2018) 30 capsule 0   No facility-administered medications prior to visit.     No Known Allergies  ROS As per HPI  PE: Blood pressure 129/80, pulse 65, temperature 98.2 F (36.8 C), temperature source Oral, resp. rate 16, height 5' 11"  (1.803 m), weight 262 lb 6 oz (119 kg), SpO2 97 %. Gen: Alert, well appearing.  Patient is oriented to person, place, time, and situation. AFFECT: pleasant, lucid thought and speech. CV: RRR, no m/r/g.   LUNGS: CTA bilat, nonlabored resps, good aeration in all lung fields. EXT: no clubbing or cyanosis.  no edema.    LABS:  Lab Results  Component Value Date   TSH 2.03 12/28/2017   Lab Results  Component Value Date   WBC 7.8 12/28/2017   HGB 16.3 12/28/2017   HCT 47.3 12/28/2017   MCV 86.3 12/28/2017   PLT 245.0 12/28/2017   Lab Results  Component Value Date   CREATININE 0.99 12/28/2017   BUN 16 12/28/2017   NA 137 12/28/2017   K 4.1 12/28/2017   CL 100 12/28/2017   CO2 29 12/28/2017   Lab Results  Component Value Date  ALT 47 12/28/2017   AST 27 12/28/2017   ALKPHOS 57 12/28/2017   BILITOT 0.5 12/28/2017   Lab Results  Component Value Date   CHOL 144 12/28/2017   Lab Results  Component Value Date   HDL 41.20 12/28/2017   Lab Results  Component Value Date   LDLCALC 80 12/28/2017   Lab Results  Component Value Date   TRIG 113.0 12/28/2017   Lab Results  Component Value Date   CHOLHDL 3 12/28/2017     IMPRESSION AND PLAN:  1) HTN: The current medical regimen is effective;  continue present plan and medications. Lytes/cr today.  2) HLD: tolerating statin.  Working on diet some but needs to get started with some regular/formal CV exercise. FLP today.  3) NASH: on statin.  Last hepatic  panel normal 6 mo ago.  He knows he needs to step up efforts at TLC/wt loss. Hepatic panel today.  An After Visit Summary was printed and given to the patient.  FOLLOW UP: Return in about 6 months (around 12/28/2018) for annual CPE (fasting).  Signed:  Crissie Sickles, MD           06/28/2018

## 2018-07-22 ENCOUNTER — Ambulatory Visit (INDEPENDENT_AMBULATORY_CARE_PROVIDER_SITE_OTHER): Payer: 59 | Admitting: Family Medicine

## 2018-07-22 ENCOUNTER — Encounter: Payer: Self-pay | Admitting: Family Medicine

## 2018-07-22 VITALS — BP 142/85 | HR 72 | Temp 98.1°F | Resp 16 | Ht 71.0 in | Wt 265.0 lb

## 2018-07-22 DIAGNOSIS — J029 Acute pharyngitis, unspecified: Secondary | ICD-10-CM

## 2018-07-22 DIAGNOSIS — J069 Acute upper respiratory infection, unspecified: Secondary | ICD-10-CM | POA: Diagnosis not present

## 2018-07-22 LAB — POCT RAPID STREP A (OFFICE): Rapid Strep A Screen: NEGATIVE

## 2018-07-22 MED ORDER — AMOXICILLIN 875 MG PO TABS: 875.0000 mg | ORAL_TABLET | Freq: Two times a day (BID) | ORAL | 0 refills | Status: DC

## 2018-07-22 NOTE — Progress Notes (Signed)
OFFICE VISIT  07/22/2018   CC:  Chief Complaint  Patient presents with  . Sore Throat   HPI:    Patient is a 52 y.o. Caucasian male who presents for respiratory complaints. Onset 4 d/a: runny nose, some ST with swallowing.  Some cough, worse at night.  Mild phlegm production. Appetite fine.  No n/v/diarrhea.  Says uvula looked swollen at onset of illness but this has improved. Exposed to a sick person at work about 10 d/a. No fever.  Wife with double lung transplant/immunocompromised.   Past Medical History:  Diagnosis Date  . ALLERGIC RHINITIS 05/12/2007   Qualifier: Diagnosis of  By: Scherrie Gerlach    . Elevated transaminase level 2009-2013   suspect NASH (2017)-pt preferred to simply start statin and skip abd u/s.  Viral Hep serologies neg.  Marland Kitchen Hyperlipidemia    Atorva intolerant (myalgias).  Rosuva 54m started 06/2017.  .Marland KitchenHypertension   . Metabolic syndrome   . Obesity, Class II, BMI 35-39.9, with comorbidity   . Other malaise and fatigue 2011   Testosterone normal 2011  . Recurrent sinusitis 11/16/2011    Past Surgical History:  Procedure Laterality Date  . SHOULDER SURGERY  2013; 2016   Left rotator cuff and biceps tendon repair (s/p fall)-2013, left.  Right 2016.    Outpatient Medications Prior to Visit  Medication Sig Dispense Refill  . aspirin 81 MG tablet Take 81 mg by mouth daily.    . cetirizine (ZYRTEC) 10 MG tablet Take 1 tablet (10 mg total) by mouth daily. 90 tablet 3  . lisinopril-hydrochlorothiazide (PRINZIDE,ZESTORETIC) 20-25 MG tablet Take 1 tablet by mouth daily. 90 tablet 3  . Multiple Vitamin (MULTIVITAMIN) tablet Take 1 tablet by mouth daily.    . rosuvastatin (CRESTOR) 10 MG tablet Take 1 tablet (10 mg total) by mouth daily. 90 tablet 3  . ibuprofen (ADVIL,MOTRIN) 800 MG tablet Take by mouth.     No facility-administered medications prior to visit.     No Known Allergies  ROS As per HPI  PE: Blood pressure (!) 142/85, pulse 72,  temperature 98.1 F (36.7 C), temperature source Oral, resp. rate 16, height 5' 11"  (1.803 m), weight 265 lb (120.2 kg), SpO2 97 %. VS: noted--normal. Gen: alert, NAD, NONTOXIC APPEARING. HEENT: eyes without injection, drainage, or swelling.  Ears: EACs clear, TMs with normal light reflex and landmarks.  Nose: Clear rhinorrhea, with some dried, crusty exudate adherent to mildly injected mucosa.  No purulent d/c.  No paranasal sinus TTP.  No facial swelling.  Throat and mouth without focal lesion.  Mild/mod soft palate swelling and erythema with petechiae suspected, mild diffuse pharyngeal erythema.  Neck: supple, no LAD but mild discomfort/tenderness to palpation.   LUNGS: CTA bilat, nonlabored resps.   CV: RRR, no m/r/g. EXT: no c/c/e SKIN: no rash    LABS:    Chemistry      Component Value Date/Time   NA 136 06/28/2018 0816   K 4.0 06/28/2018 0816   CL 101 06/28/2018 0816   CO2 28 06/28/2018 0816   BUN 15 06/28/2018 0816   CREATININE 0.96 06/28/2018 0816      Component Value Date/Time   CALCIUM 9.6 06/28/2018 0816   ALKPHOS 67 06/28/2018 0816   AST 27 06/28/2018 0816   ALT 43 06/28/2018 0816   BILITOT 0.5 06/28/2018 0816     POC Rapid strep today: NEG  IMPRESSION AND PLAN:  URI, nonspecific. Many sx's pointing towards viral etiology. However, given 5%  false neg rate of rapid strep, swollen soft palate with petechiae, and wife being immunocomprominsed, I think it is reasonable to treat for strep throat and send throat culture. If culture neg, pt can stop antibiotic.   Symptomatic care discussed.  An After Visit Summary was printed and given to the patient.  FOLLOW UP: Return if symptoms worsen or fail to improve.  Signed:  Crissie Sickles, MD           07/22/2018

## 2018-07-24 LAB — CULTURE, GROUP A STREP
MICRO NUMBER:: 91351249
SPECIMEN QUALITY:: ADEQUATE

## 2019-01-09 ENCOUNTER — Encounter: Payer: 59 | Admitting: Family Medicine

## 2019-03-23 ENCOUNTER — Encounter: Payer: 59 | Admitting: Family Medicine

## 2019-04-26 ENCOUNTER — Encounter: Payer: 59 | Admitting: Family Medicine

## 2019-06-08 ENCOUNTER — Ambulatory Visit (INDEPENDENT_AMBULATORY_CARE_PROVIDER_SITE_OTHER): Payer: 59 | Admitting: Family Medicine

## 2019-06-08 ENCOUNTER — Other Ambulatory Visit: Payer: Self-pay

## 2019-06-08 ENCOUNTER — Encounter: Payer: Self-pay | Admitting: Family Medicine

## 2019-06-08 VITALS — BP 132/88 | HR 68 | Temp 98.3°F | Resp 16 | Ht 71.0 in | Wt 232.6 lb

## 2019-06-08 DIAGNOSIS — I10 Essential (primary) hypertension: Secondary | ICD-10-CM | POA: Diagnosis not present

## 2019-06-08 DIAGNOSIS — Z1211 Encounter for screening for malignant neoplasm of colon: Secondary | ICD-10-CM

## 2019-06-08 DIAGNOSIS — E7800 Pure hypercholesterolemia, unspecified: Secondary | ICD-10-CM | POA: Diagnosis not present

## 2019-06-08 DIAGNOSIS — Z125 Encounter for screening for malignant neoplasm of prostate: Secondary | ICD-10-CM

## 2019-06-08 DIAGNOSIS — M7631 Iliotibial band syndrome, right leg: Secondary | ICD-10-CM | POA: Diagnosis not present

## 2019-06-08 DIAGNOSIS — Z0000 Encounter for general adult medical examination without abnormal findings: Secondary | ICD-10-CM

## 2019-06-08 LAB — CBC WITH DIFFERENTIAL/PLATELET
Basophils Absolute: 0.1 10*3/uL (ref 0.0–0.1)
Basophils Relative: 0.7 % (ref 0.0–3.0)
Eosinophils Absolute: 0.1 10*3/uL (ref 0.0–0.7)
Eosinophils Relative: 1.3 % (ref 0.0–5.0)
HCT: 48.5 % (ref 39.0–52.0)
Hemoglobin: 16.2 g/dL (ref 13.0–17.0)
Lymphocytes Relative: 30.4 % (ref 12.0–46.0)
Lymphs Abs: 2.4 10*3/uL (ref 0.7–4.0)
MCHC: 33.3 g/dL (ref 30.0–36.0)
MCV: 87.4 fl (ref 78.0–100.0)
Monocytes Absolute: 0.4 10*3/uL (ref 0.1–1.0)
Monocytes Relative: 5.2 % (ref 3.0–12.0)
Neutro Abs: 4.8 10*3/uL (ref 1.4–7.7)
Neutrophils Relative %: 62.4 % (ref 43.0–77.0)
Platelets: 227 10*3/uL (ref 150.0–400.0)
RBC: 5.54 Mil/uL (ref 4.22–5.81)
RDW: 14.3 % (ref 11.5–15.5)
WBC: 7.8 10*3/uL (ref 4.0–10.5)

## 2019-06-08 LAB — LIPID PANEL
Cholesterol: 207 mg/dL — ABNORMAL HIGH (ref 0–200)
HDL: 44 mg/dL (ref >39.00)
LDL Cholesterol: 144 mg/dL — ABNORMAL HIGH (ref 0–99)
NonHDL: 163.09
Total CHOL/HDL Ratio: 5
Triglycerides: 97 mg/dL (ref 0.0–149.0)
VLDL: 19.4 mg/dL (ref 0.0–40.0)

## 2019-06-08 LAB — COMPREHENSIVE METABOLIC PANEL
ALT: 22 U/L (ref 0–53)
AST: 22 U/L (ref 0–37)
Albumin: 4.9 g/dL (ref 3.5–5.2)
Alkaline Phosphatase: 75 U/L (ref 39–117)
BUN: 21 mg/dL (ref 6–23)
CO2: 28 mEq/L (ref 19–32)
Calcium: 10.4 mg/dL (ref 8.4–10.5)
Chloride: 100 mEq/L (ref 96–112)
Creatinine, Ser: 0.88 mg/dL (ref 0.40–1.50)
GFR: 91.37 mL/min (ref >60.00)
Glucose, Bld: 91 mg/dL (ref 70–99)
Potassium: 4.1 mEq/L (ref 3.5–5.1)
Sodium: 139 mEq/L (ref 135–145)
Total Bilirubin: 0.6 mg/dL (ref 0.2–1.2)
Total Protein: 7.2 g/dL (ref 6.0–8.3)

## 2019-06-08 LAB — TSH: TSH: 1.57 u[IU]/mL (ref 0.35–4.50)

## 2019-06-08 LAB — PSA: PSA: 0.41 ng/mL (ref 0.10–4.00)

## 2019-06-08 NOTE — Patient Instructions (Signed)
Iliotibial Band Syndrome  Iliotibial band syndrome (ITBS) is a condition that often causes knee pain. It can also cause pain in the outside of your hip, thigh, and knee. The iliotibial band is a strip of tissue that runs from the outside of your hip and down your thigh to the outside of your knee. Repeatedly bending and straightening your knee can irritate the iliotibial band. What are the causes? This condition is caused by inflammation and irritation from the friction of the iliotibial band moving over the thigh bone (femur) when you repeatedly bend and straighten your knee. What increases the risk? This condition is more likely to develop in people who:  Frequently change elevation during their workouts.  Run very long distances.  Recently increased the length or intensity of their workouts.  Run downhill often, or just started running downhill.  Ride a bike very far or often. You may also be at greater risk if you start a new workout routine without first warming up or if you have a job that requires you to bend, squat, or climb frequently. What are the signs or symptoms? Symptoms of this condition include:  Pain along the outside of your knee that may be worse with activity, especially running or going up and down stairs.  A "snapping" sensation over your knee.  Swelling on the outside of your knee.  Pain or a feeling of tightness in your hip. How is this diagnosed? This condition is diagnosed based on your symptoms, medical history, and physical exam. You may also see a health care provider who specializes in reducing pain and increasing mobility (physical therapist). A physical therapist may do an exam to check your balance, movement, and way of walking or running (gait) to see whether the way you move could contribute to your injury. You may also have tests to measure your strength, flexibility, and range of motion. How is this treated? Treatment for this condition includes:   Resting and limiting exercise.  Returning to activities gradually.  Doing range-of-motion and strengthening exercises (physical therapy) as told by your health care provider.  Including low-impact activities, such as swimming, in your exercise routine. Follow these instructions at home:  If directed, apply ice to the injured area. ? Put ice in a plastic bag. ? Place a towel between your skin and the bag. ? Leave the ice on for 20 minutes, 2-3 times per day.  Return to your normal activities as told by your health care provider. Ask your health care provider what activities are safe for you.  Keep all follow-up visits with your health care provider. This is important. Contact a health care provider if:  Your pain does not improve or gets worse despite treatment. This information is not intended to replace advice given to you by your health care provider. Make sure you discuss any questions you have with your health care provider. Document Released: 02/20/2002 Document Revised: 08/13/2017 Document Reviewed: 10/02/2016 Elsevier Patient Education  2020 [employer].

## 2019-06-08 NOTE — Progress Notes (Signed)
Office Note 06/08/2019   CC: No chief complaint on file.  HPI:  Vernon Mccormick is a 53 y.o. White male who is here for annual health maintenance exam.  He has been working hard on TLC, has purposefully lost about 30 lbs in the last 46mo stopped taking his rosuvastatin 6 mo ago. Not much exercise. Diet->he has been on keto diet, fresh veggies, smaller portions.  Some fried foods.   No acute complaints.  Past Medical History:  Diagnosis Date  . ALLERGIC RHINITIS 05/12/2007   Qualifier: Diagnosis of  By: TScherrie Gerlach   . Elevated transaminase level 2009-2013   suspect NASH (2017)-pt preferred to simply start statin and skip abd u/s.  Viral Hep serologies neg.  .Marland KitchenHyperlipidemia    Atorva intolerant (myalgias).  Rosuva 119mstarted 06/2017.  . Marland Kitchenypertension   . Metabolic syndrome   . Obesity, Class II, BMI 35-39.9, with comorbidity   . Other malaise and fatigue 2011   Testosterone normal 2011  . Recurrent sinusitis 11/16/2011    Past Surgical History:  Procedure Laterality Date  . SHOULDER SURGERY  2013; 2016   Left rotator cuff and biceps tendon repair (s/p fall)-2013, left.  Right 2016.    Family History  Problem Relation Age of Onset  . Hypertension Father   . Hypertension Mother   . Alzheimer's disease Mother   . Celiac disease Mother   . Osteoporosis Mother   . Stroke Paternal Grandfather   . Heart disease Paternal Grandfather   . Hypertension Paternal Grandfather   . Diabetes Paternal Grandfather   . Kidney disease Paternal Grandfather     Social History   Socioeconomic History  . Marital status: Married    Spouse name: JuAlmyra Free. Number of children: 2  . Years of education: HS  . Highest education level: Not on file  Occupational History    Employer: OTHER    Comment: [employer]  Social Needs  . Financial resource strain: Not on file  . Food insecurity    Worry: Not on file    Inability: Not on file  . Transportation needs    Medical: Not on  file    Non-medical: Not on file  Tobacco Use  . Smoking status: Never Smoker  . Smokeless tobacco: Former [employer] developer  Types: Chew  Substance and Sexual Activity  . Alcohol use: Yes    Comment: socially  . Drug use: No  . Sexual activity: Not on file  Lifestyle  . Physical activity    Days per week: Not on file    Minutes per session: Not on file  . Stress: Not on file  Relationships  . Social coHerbalistn phone: Not on file    Gets together: Not on file    Attends religious service: Not on file    Active member of club or organization: Not on file    Attends meetings of clubs or organizations: Not on file    Relationship status: Not on file  . Intimate partner violence    Fear of current or ex partner: Not on file    Emotionally abused: Not on file    Physically abused: Not on file    Forced sexual activity: Not on file  Other Topics Concern  . Not on file  Social History Narrative   Married, 2 kids.   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   No Tob.  Rare/social ETOH.  No drugs.    Outpatient Medications Prior to Visit  Medication Sig Dispense Refill  . amoxicillin (AMOXIL) 875 MG tablet Take 1 tablet (875 mg total) by mouth 2 (two) times daily. 20 tablet 0  . aspirin 81 MG tablet Take 81 mg by mouth daily.    . cetirizine (ZYRTEC) 10 MG tablet Take 1 tablet (10 mg total) by mouth daily. 90 tablet 3  . lisinopril-hydrochlorothiazide (PRINZIDE,ZESTORETIC) 20-25 MG tablet Take 1 tablet by mouth daily. 90 tablet 3  . Multiple Vitamin (MULTIVITAMIN) tablet Take 1 tablet by mouth daily.    . rosuvastatin (CRESTOR) 10 MG tablet Take 1 tablet (10 mg total) by mouth daily. 90 tablet 3   No facility-administered medications prior to visit.    2 No Known Allergies  ROS  PE; There were no vitals taken for this visit. Gen: Alert, well appearing.  Patient is oriented to person, place, time, and  situation. AFFECT: pleasant, lucid thought and speech. ENT: Ears: EACs clear, normal epithelium.  TMs with good light reflex and landmarks bilaterally.  Eyes: no injection, icteris, swelling, or exudate.  EOMI, PERRLA. Nose: no drainage or turbinate edema/swelling.  No injection or focal lesion.  Mouth: lips without lesion/swelling.  Oral mucosa pink and moist.  Dentition intact and without obvious caries or gingival swelling.  Oropharynx without erythema, exudate, or swelling.  Neck: supple/nontender.  No LAD, mass, or TM.  Carotid pulses 2+ bilaterally, without bruits. CV: RRR, no m/r/g.   LUNGS: CTA bilat, nonlabored resps, good aeration in all lung fields. ABD: soft, NT, ND, BS normal.  No hepatospenomegaly or mass.  No bruits. EXT: no clubbing, cyanosis, or edema.  Musculoskeletal: no joint swelling, erythema, warmth, or tenderness.  ROM of all joints intact except hips with decreased flexion and ER bilat, R>L.  Mild TTP around greater troch on R, a bit of TTP along IT band and insertion of IT band on tibia. LE strength 5/5 prox/dist bilat.  No knees swelling, erythema, or tenderness.  No knee instability. Skin - no sores or suspicious lesions or rashes or color changes Rectal exam: negative without mass, lesions or tenderness, PROSTATE EXAM: smooth and symmetric without nodules or tenderness.   Pertinent labs:  Lab Results  Component Value Date   TSH 2.03 12/28/2017   Lab Results  Component Value Date   WBC 7.8 12/28/2017   HGB 16.3 12/28/2017   HCT 47.3 12/28/2017   MCV 86.3 12/28/2017   PLT 245.0 12/28/2017   Lab Results  Component Value Date   CREATININE 0.96 06/28/2018   BUN 15 06/28/2018   NA 136 06/28/2018   K 4.0 06/28/2018   CL 101 06/28/2018   CO2 28 06/28/2018   Lab Results  Component Value Date   ALT 43 06/28/2018   AST 27 06/28/2018   ALKPHOS 67 06/28/2018   BILITOT 0.5 06/28/2018   Lab Results  Component Value Date   CHOL 136 06/28/2018   Lab Results   Component Value Date   HDL 36.70 (L) 06/28/2018   Lab Results  Component Value Date   LDLCALC 75 06/28/2018   Lab Results  Component Value Date   TRIG 122.0 06/28/2018   Lab Results  Component Value Date   CHOLHDL 4 06/28/2018   No results found for: HGBA1C   ASSESSMENT AND PLAN:   Health maintenance exam: Reviewed age and gender appropriate health maintenance issues (prudent diet, regular exercise, health  risks of tobacco and excessive alcohol, use of seatbelts, fire alarms in home, use of sunscreen).  Also reviewed age and gender appropriate health screening as well as vaccine recommendations. Vaccines: he will get this via his employer. Labs: fasting labs + PSA today. Prostate ca screening: DRE normal today , PSA. Colon ca screening:  Refer to GI today.  An After Visit Summary was printed and given to the patient.  FOLLOW UP:  No follow-ups on file.  Signed:  Crissie Sickles, MD           06/08/2019

## 2019-06-10 ENCOUNTER — Other Ambulatory Visit: Payer: Self-pay | Admitting: Family Medicine

## 2019-06-14 ENCOUNTER — Encounter: Payer: Self-pay | Admitting: Family Medicine

## 2019-06-15 ENCOUNTER — Other Ambulatory Visit: Payer: Self-pay

## 2019-06-15 MED ORDER — EZETIMIBE 10 MG PO TABS: 10.0000 mg | ORAL_TABLET | Freq: Every day | ORAL | 5 refills | Status: DC

## 2019-07-10 ENCOUNTER — Encounter: Payer: Self-pay | Admitting: Family Medicine

## 2019-09-01 ENCOUNTER — Other Ambulatory Visit: Payer: Self-pay | Admitting: Family Medicine

## 2019-12-12 ENCOUNTER — Telehealth: Payer: Self-pay

## 2019-12-12 NOTE — Telephone Encounter (Signed)
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

## 2019-12-12 NOTE — Telephone Encounter (Signed)
LM for pt to return call. Pt is currently due for next routine f/u and they would like him to have labs and general exam and EKG.

## 2019-12-13 NOTE — Telephone Encounter (Signed)
Patient scheduled an appointment on 12/18/19

## 2019-12-13 NOTE — Telephone Encounter (Signed)
Will hold onto form until pt's appt.

## 2019-12-14 HISTORY — PX: TOTAL HIP ARTHROPLASTY: SHX124

## 2019-12-18 ENCOUNTER — Other Ambulatory Visit: Payer: Self-pay

## 2019-12-18 ENCOUNTER — Ambulatory Visit (INDEPENDENT_AMBULATORY_CARE_PROVIDER_SITE_OTHER): Payer: 59 | Admitting: Family Medicine

## 2019-12-18 ENCOUNTER — Encounter: Payer: Self-pay | Admitting: Family Medicine

## 2019-12-18 VITALS — BP 134/85 | HR 65 | Temp 98.0°F | Resp 16 | Ht 71.0 in | Wt 249.0 lb

## 2019-12-18 DIAGNOSIS — I10 Essential (primary) hypertension: Secondary | ICD-10-CM | POA: Diagnosis not present

## 2019-12-18 DIAGNOSIS — E7800 Pure hypercholesterolemia, unspecified: Secondary | ICD-10-CM

## 2019-12-18 DIAGNOSIS — Z01818 Encounter for other preprocedural examination: Secondary | ICD-10-CM

## 2019-12-18 DIAGNOSIS — E669 Obesity, unspecified: Secondary | ICD-10-CM | POA: Diagnosis not present

## 2019-12-18 LAB — COMPREHENSIVE METABOLIC PANEL
ALT: 30 U/L (ref 0–53)
AST: 25 U/L (ref 0–37)
Albumin: 4.6 g/dL (ref 3.5–5.2)
Alkaline Phosphatase: 71 U/L (ref 39–117)
BUN: 18 mg/dL (ref 6–23)
CO2: 25 mEq/L (ref 19–32)
Calcium: 9.9 mg/dL (ref 8.4–10.5)
Chloride: 99 mEq/L (ref 96–112)
Creatinine, Ser: 0.95 mg/dL (ref 0.40–1.50)
GFR: 83.47 mL/min (ref >60.00)
Glucose, Bld: 92 mg/dL (ref 70–99)
Potassium: 4 mEq/L (ref 3.5–5.1)
Sodium: 137 mEq/L (ref 135–145)
Total Bilirubin: 0.6 mg/dL (ref 0.2–1.2)
Total Protein: 6.9 g/dL (ref 6.0–8.3)

## 2019-12-18 LAB — LIPID PANEL
Cholesterol: 192 mg/dL (ref 0–200)
HDL: 41.1 mg/dL (ref >39.00)
LDL Cholesterol: 128 mg/dL — ABNORMAL HIGH (ref 0–99)
NonHDL: 150.53
Total CHOL/HDL Ratio: 5
Triglycerides: 113 mg/dL (ref 0.0–149.0)
VLDL: 22.6 mg/dL (ref 0.0–40.0)

## 2019-12-18 LAB — CBC WITH DIFFERENTIAL/PLATELET
Basophils Absolute: 0.1 10*3/uL (ref 0.0–0.1)
Basophils Relative: 0.7 % (ref 0.0–3.0)
Eosinophils Absolute: 0.1 10*3/uL (ref 0.0–0.7)
Eosinophils Relative: 1.5 % (ref 0.0–5.0)
HCT: 44.7 % (ref 39.0–52.0)
Hemoglobin: 15.4 g/dL (ref 13.0–17.0)
Lymphocytes Relative: 33.1 % (ref 12.0–46.0)
Lymphs Abs: 2.4 10*3/uL (ref 0.7–4.0)
MCHC: 34.6 g/dL (ref 30.0–36.0)
MCV: 86.2 fl (ref 78.0–100.0)
Monocytes Absolute: 0.5 10*3/uL (ref 0.1–1.0)
Monocytes Relative: 6.6 % (ref 3.0–12.0)
Neutro Abs: 4.2 10*3/uL (ref 1.4–7.7)
Neutrophils Relative %: 58.1 % (ref 43.0–77.0)
Platelets: 193 10*3/uL (ref 150.0–400.0)
RBC: 5.18 Mil/uL (ref 4.22–5.81)
RDW: 13.9 % (ref 11.5–15.5)
WBC: 7.2 10*3/uL (ref 4.0–10.5)

## 2019-12-18 LAB — HEMOGLOBIN A1C: Hgb A1c MFr Bld: 5.8 % (ref 4.6–6.5)

## 2019-12-18 NOTE — Addendum Note (Signed)
Addended by: Tammi Sou on: 12/18/2019 06:28 PM   Modules accepted: Orders

## 2019-12-18 NOTE — Progress Notes (Signed)
Office Note 12/18/2019  CC:  Chief Complaint  Patient presents with  . Surgical clearance    HPI:  Vernon Mccormick is a 54 y.o. White male who is here for HTN, HLD, and obesity.  Also here for medical clearance for upcoming surgery.  Plan is right THA, spinal anesthesia. Has end stage arthritis, constant pain and limp.  Plan is for L hip arthroplasty in 1 yr. Dr. Delfino Lovett.  He has no hx of cardiac valvular problems/stenosis/regurg. No hx of pulm HTN, pacer/ICD, or severe systemic dz. No problems in the past with anesthesia.  His medical problems have been optimized: HTN, HLD. OSA sx's: no snoring.  No hx of witnessed apnea.  Currently feeling weight. Has not been able to walk/exercise due to pain. Can walk up and downs stairs 15-20 times per day w/out CP or SOB/DOE. Taking 800 mg ibup daily at this time.  He does not smoke. No home bp monitoring. Compliant with zetia w/out side effects.  He is statin-intol. Has put on wt the last year b/c of lack of exercise due to hip pain.  Past Medical History:  Diagnosis Date  . ALLERGIC RHINITIS 05/12/2007   Qualifier: Diagnosis of  By: Scherrie Gerlach    . Elevated transaminase level 2009-2013   suspect NASH (2017)-pt preferred to simply start statin and skip abd u/s.  Viral Hep serologies neg.  Marland Kitchen Hyperlipidemia    Atorva intolerant (myalgias).  Rosuva 7m started 06/2017-intol.  Zetia trial started 06/14/19  . Hypertension   . Metabolic syndrome   . Obesity, Class II, BMI 35-39.9, with comorbidity   . Other malaise and fatigue 2011   Testosterone normal 2011  . Recurrent sinusitis 11/16/2011    Past Surgical History:  Procedure Laterality Date  . SHOULDER SURGERY  2013; 2016   Left rotator cuff and biceps tendon repair (s/p fall)-2013, left.  Right 2016.    Family History  Problem Relation Age of Onset  . Hypertension Father   . Hypertension Mother   . Alzheimer's disease Mother   . Celiac disease Mother   .  Osteoporosis Mother   . Stroke Paternal Grandfather   . Heart disease Paternal Grandfather   . Hypertension Paternal Grandfather   . Diabetes Paternal Grandfather   . Kidney disease Paternal Grandfather     Social History   Socioeconomic History  . Marital status: Married    Spouse name: JAlmyra Free . Number of children: 2  . Years of education: HS  . Highest education level: Not on file  Occupational History    Employer: OTHER    Comment: [employer]  Tobacco Use  . Smoking status: Never Smoker  . Smokeless tobacco: Former [employer] developer   Types: Chew  Substance and Sexual Activity  . Alcohol use: Yes    Comment: socially  . Drug use: No  . Sexual activity: Not on file  Other Topics Concern  . Not on file  Social History Narrative   Married, 2 kids.   [place] time local resident, grad of [employer]     Self employed: sells parts to [employer]   No Tob.  Rare/social ETOH.  No drugs.   Social Determinants of Health   Financial Resource Strain:   . Difficulty of Paying Living Expenses:   Food Insecurity:   . Worried About RCharity fundraiserin the Last Year:   . RArboriculturistin the Last Year:   Transportation Needs:   .  Lack of Transportation (Medical):   Marland Kitchen Lack of Transportation (Non-Medical):   Physical Activity:   . Days of Exercise per Week:   . Minutes of Exercise per Session:   Stress:   . Feeling of Stress :   Social Connections:   . Frequency of Communication with Friends and Family:   . Frequency of Social Gatherings with Friends and Family:   . Attends Religious Services:   . Active Member of Clubs or Organizations:   . Attends Archivist Meetings:   Marland Kitchen Marital Status:   Intimate Partner Violence:   . Fear of Current or Ex-Partner:   . Emotionally Abused:   Marland Kitchen Physically Abused:   . Sexually Abused:     Outpatient Medications Prior to Visit  Medication Sig Dispense Refill  . Ascorbic Acid (VITAMIN C PO) Take by mouth daily.    Marland Kitchen aspirin  81 MG tablet Take 81 mg by mouth daily.    . cetirizine (ZYRTEC) 10 MG tablet TAKE 1 TABLET BY MOUTH EVERY DAY 90 tablet 3  . ezetimibe (ZETIA) 10 MG tablet Take 1 tablet (10 mg total) by mouth daily. 30 tablet 5  . lisinopril-hydrochlorothiazide (ZESTORETIC) 20-25 MG tablet TAKE 1 TABLET BY MOUTH EVERY DAY 90 tablet 1  . Multiple Vitamin (MULTIVITAMIN) tablet Take 1 tablet by mouth daily.    . Multiple Vitamins-Minerals (ZINC PO) Take by mouth daily.     No facility-administered medications prior to visit.    No Known Allergies  ROS Review of Systems  Constitutional: Negative for appetite change, chills, fatigue and fever.  HENT: Negative for congestion, dental problem, ear pain and sore throat.   Eyes: Negative for discharge, redness and visual disturbance.  Respiratory: Negative for cough, chest tightness, shortness of breath and wheezing.   Cardiovascular: Negative for chest pain, palpitations and leg swelling.  Gastrointestinal: Negative for abdominal pain, blood in stool, diarrhea, nausea and vomiting.  Genitourinary: Negative for difficulty urinating, dysuria, flank pain, frequency, hematuria and urgency.  Musculoskeletal: Negative for arthralgias, back pain, joint swelling, myalgias and neck stiffness.  Skin: Negative for pallor and rash.  Neurological: Negative for dizziness, speech difficulty, weakness and headaches.  Hematological: Negative for adenopathy. Does not bruise/bleed easily.  Psychiatric/Behavioral: Negative for confusion and sleep disturbance. The patient is not nervous/anxious.     PE; Initial bp 134/85 automated cuff.  Repeat manual cuff at end of o/v was 122/78. Blood pressure 134/85, pulse 65, temperature 98 F (36.7 C), temperature source Temporal, resp. rate 16, height 5' 11"  (1.803 m), weight 249 lb (112.9 kg), SpO2 98 %. Body mass index is 34.73 kg/m.  Gen: Alert, well appearing.  Patient is oriented to person, place, time, and situation. AFFECT:  pleasant, lucid thought and speech. ENT: EML:JQGB: no injection, icteris, swelling, or exudate.  EOMI, PERRLA. Mouth: lips without lesion/swelling.  Oral mucosa pink and moist. Oropharynx without erythema, exudate, or swelling.  Neck: supple/nontender.  No LAD, mass, or TM.  Carotid pulses 2+ bilaterally, without bruits. CV: RRR, no m/r/g.   LUNGS: CTA bilat, nonlabored resps, good aeration in all lung fields. ABD: soft, NT, ND, BS normal.  No hepatospenomegaly or mass.  No bruits. EXT: no clubbing, cyanosis, or edema.  Musculoskeletal: no joint swelling, erythema, warmth, or tenderness.  ROM of all joints intact. Skin - no sores or suspicious lesions or rashes or color changes   Pertinent labs:  Lab Results  Component Value Date   TSH 1.57 06/08/2019   Lab Results  Component Value Date   WBC 7.8 06/08/2019   HGB 16.2 06/08/2019   HCT 48.5 06/08/2019   MCV 87.4 06/08/2019   PLT 227.0 06/08/2019   Lab Results  Component Value Date   CREATININE 0.88 06/08/2019   BUN 21 06/08/2019   NA 139 06/08/2019   K 4.1 06/08/2019   CL 100 06/08/2019   CO2 28 06/08/2019   Lab Results  Component Value Date   ALT 22 06/08/2019   AST 22 06/08/2019   ALKPHOS 75 06/08/2019   BILITOT 0.6 06/08/2019   Lab Results  Component Value Date   CHOL 207 (H) 06/08/2019   Lab Results  Component Value Date   HDL 44.00 06/08/2019   Lab Results  Component Value Date   LDLCALC 144 (H) 06/08/2019   Lab Results  Component Value Date   TRIG 97.0 06/08/2019   Lab Results  Component Value Date   CHOLHDL 5 06/08/2019   Lab Results  Component Value Date   PSA 0.41 06/08/2019    ASSESSMENT AND PLAN:   1) HTN: well controlled on zestoretic 20-25 qd. BMET today.  2) HLD: statin intol but tolerates zetia daily. FLP and hepatic panel today.  3) Preoperative medical clearance. Pending lab results he is cleared for R hip THA with spinal anesthesia w/out need of any further work  up. According to latest research on ASA for primary CV prevention, his benefit from ASA is not statistically significant and likely outweighed by the risks.  OK to d/c ASA today. Filled out medical clearance form--labs pending.  CBC, CMET, FLP, M7A (metabolic syndrome, HLD, HTN).  An After Visit Summary was printed and given to the patient.  FOLLOW UP:  Return in about 6 months (around 06/18/2020) for annual CPE (fasting).  Signed:  Crissie Sickles, MD           12/18/2019

## 2019-12-23 ENCOUNTER — Other Ambulatory Visit: Payer: Self-pay | Admitting: Family Medicine

## 2020-02-26 ENCOUNTER — Other Ambulatory Visit: Payer: Self-pay | Admitting: Family Medicine

## 2020-03-09 ENCOUNTER — Encounter: Payer: Self-pay | Admitting: Family Medicine

## 2020-03-19 ENCOUNTER — Encounter: Payer: Self-pay | Admitting: Family Medicine

## 2020-05-29 ENCOUNTER — Other Ambulatory Visit: Payer: Self-pay | Admitting: Family Medicine

## 2020-06-11 ENCOUNTER — Other Ambulatory Visit: Payer: Self-pay

## 2020-06-12 ENCOUNTER — Other Ambulatory Visit: Payer: Self-pay

## 2020-06-12 ENCOUNTER — Encounter: Payer: Self-pay | Admitting: Family Medicine

## 2020-06-12 ENCOUNTER — Ambulatory Visit (INDEPENDENT_AMBULATORY_CARE_PROVIDER_SITE_OTHER): Payer: 59 | Admitting: Family Medicine

## 2020-06-12 VITALS — BP 135/90 | HR 71 | Temp 98.5°F | Resp 18 | Ht 71.0 in | Wt 255.8 lb

## 2020-06-12 DIAGNOSIS — Z1211 Encounter for screening for malignant neoplasm of colon: Secondary | ICD-10-CM | POA: Diagnosis not present

## 2020-06-12 DIAGNOSIS — E669 Obesity, unspecified: Secondary | ICD-10-CM

## 2020-06-12 DIAGNOSIS — Z125 Encounter for screening for malignant neoplasm of prostate: Secondary | ICD-10-CM

## 2020-06-12 DIAGNOSIS — E8881 Metabolic syndrome: Secondary | ICD-10-CM

## 2020-06-12 DIAGNOSIS — Z01818 Encounter for other preprocedural examination: Secondary | ICD-10-CM | POA: Diagnosis not present

## 2020-06-12 DIAGNOSIS — Z0000 Encounter for general adult medical examination without abnormal findings: Secondary | ICD-10-CM | POA: Diagnosis not present

## 2020-06-12 DIAGNOSIS — I10 Essential (primary) hypertension: Secondary | ICD-10-CM

## 2020-06-12 DIAGNOSIS — E7800 Pure hypercholesterolemia, unspecified: Secondary | ICD-10-CM

## 2020-06-12 DIAGNOSIS — R319 Hematuria, unspecified: Secondary | ICD-10-CM

## 2020-06-12 DIAGNOSIS — E66812 Obesity, class 2: Secondary | ICD-10-CM

## 2020-06-12 LAB — COMPREHENSIVE METABOLIC PANEL
ALT: 28 U/L (ref 0–53)
AST: 23 U/L (ref 0–37)
Albumin: 4.4 g/dL (ref 3.5–5.2)
Alkaline Phosphatase: 73 U/L (ref 39–117)
BUN: 21 mg/dL (ref 6–23)
CO2: 28 mEq/L (ref 19–32)
Calcium: 9.3 mg/dL (ref 8.4–10.5)
Chloride: 100 mEq/L (ref 96–112)
Creatinine, Ser: 0.98 mg/dL (ref 0.40–1.50)
GFR: 80.37 mL/min (ref >60.00)
Glucose, Bld: 97 mg/dL (ref 70–99)
Potassium: 3.7 mEq/L (ref 3.5–5.1)
Sodium: 137 mEq/L (ref 135–145)
Total Bilirubin: 0.6 mg/dL (ref 0.2–1.2)
Total Protein: 6.7 g/dL (ref 6.0–8.3)

## 2020-06-12 LAB — CBC WITH DIFFERENTIAL/PLATELET
Basophils Absolute: 0 10*3/uL (ref 0.0–0.1)
Basophils Relative: 0.8 % (ref 0.0–3.0)
Eosinophils Absolute: 0.1 10*3/uL (ref 0.0–0.7)
Eosinophils Relative: 2.2 % (ref 0.0–5.0)
HCT: 42.4 % (ref 39.0–52.0)
Hemoglobin: 14.2 g/dL (ref 13.0–17.0)
Lymphocytes Relative: 38.9 % (ref 12.0–46.0)
Lymphs Abs: 2.2 10*3/uL (ref 0.7–4.0)
MCHC: 33.4 g/dL (ref 30.0–36.0)
MCV: 85 fl (ref 78.0–100.0)
Monocytes Absolute: 0.4 10*3/uL (ref 0.1–1.0)
Monocytes Relative: 6.7 % (ref 3.0–12.0)
Neutro Abs: 2.9 10*3/uL (ref 1.4–7.7)
Neutrophils Relative %: 51.4 % (ref 43.0–77.0)
Platelets: 271 10*3/uL (ref 150.0–400.0)
RBC: 4.99 Mil/uL (ref 4.22–5.81)
RDW: 15.3 % (ref 11.5–15.5)
WBC: 5.6 10*3/uL (ref 4.0–10.5)

## 2020-06-12 LAB — POCT URINALYSIS DIPSTICK
Bilirubin, UA: NEGATIVE
Blood, UA: 10
Glucose, UA: NEGATIVE
Ketones, UA: NEGATIVE
Leukocytes, UA: NEGATIVE
Nitrite, UA: NEGATIVE
Protein, UA: POSITIVE — AB
Spec Grav, UA: 1.025 (ref 1.010–1.025)
Urobilinogen, UA: 0.2 E.U./dL
pH, UA: 6 (ref 5.0–8.0)

## 2020-06-12 LAB — URINALYSIS, ROUTINE W REFLEX MICROSCOPIC
Bilirubin Urine: NEGATIVE
Hgb urine dipstick: NEGATIVE
Ketones, ur: NEGATIVE
Leukocytes,Ua: NEGATIVE
Nitrite: NEGATIVE
RBC / HPF: NONE SEEN (ref 0–?)
Specific Gravity, Urine: 1.02 (ref 1.000–1.030)
Total Protein, Urine: NEGATIVE
Urine Glucose: NEGATIVE
Urobilinogen, UA: 0.2 (ref 0.0–1.0)
WBC, UA: NONE SEEN (ref 0–?)
pH: 6 (ref 5.0–8.0)

## 2020-06-12 LAB — LIPID PANEL
Cholesterol: 203 mg/dL — ABNORMAL HIGH (ref 0–200)
HDL: 40.2 mg/dL (ref >39.00)
LDL Cholesterol: 128 mg/dL — ABNORMAL HIGH (ref 0–99)
NonHDL: 162.79
Total CHOL/HDL Ratio: 5
Triglycerides: 173 mg/dL — ABNORMAL HIGH (ref 0.0–149.0)
VLDL: 34.6 mg/dL (ref 0.0–40.0)

## 2020-06-12 LAB — HEMOGLOBIN A1C: Hgb A1c MFr Bld: 6.1 % (ref 4.6–6.5)

## 2020-06-12 LAB — PROTIME-INR
INR: 0.9 ratio (ref 0.8–1.0)
Prothrombin Time: 10.6 s (ref 9.6–13.1)

## 2020-06-12 LAB — PSA: PSA: 0.34 ng/mL (ref 0.10–4.00)

## 2020-06-12 LAB — TSH: TSH: 2.34 u[IU]/mL (ref 0.35–4.50)

## 2020-06-12 NOTE — Progress Notes (Signed)
Office Note 06/12/2020  CC:  Chief Complaint  Patient presents with   Annual Exam    doing well , no compliants     HPI:  Vernon Mccormick is a 54 y.o. White male who is here for annual health maintenance exam and f/u HTN and HLD.  Needs pre-operative clearance for surgery. Planning on getting L hip replacement Nov 12th, spinal anesthesia. Did fine with anesthesia last hip replacement 6 mo ago. No CP, SOB, DOE, syncope, palpitations, or dizziness. No hx of OSA, cardiac valvular problems, or cardiac function.  Has been feeling well.   HTN: not monitoring bp at home at all.  HLD: admits to taking his zetia only about 50% of the time, finds it hard to remember to take.  No side effects. Says intake of high fat foods has gone down. Not as active since hip surgery 5 mo ago.   Past Medical History:  Diagnosis Date   ALLERGIC RHINITIS 05/12/2007   Qualifier: Diagnosis of  By: Sherren Mocha RN, Dorian Pod     Elevated transaminase level 2009-2013   suspect NASH (2017)-pt preferred to simply start statin and skip abd u/s.  Viral Hep serologies neg.   Hyperlipidemia    Atorva intolerant (myalgias).  Rosuva 81m started 06/2017-intol.  Zetia trial started 06/14/19   Hypertension    Metabolic syndrome    Obesity, Class II, BMI 35-39.9, with comorbidity    Osteoarthritis of both hips    R THA planned as of 01/2020; L THA likely soon to follow   Other malaise and fatigue 2011   Testosterone normal 2011   Recurrent sinusitis 11/16/2011    Past Surgical History:  Procedure Laterality Date   SHOULDER SURGERY  2013; 2016   Left rotator cuff and biceps tendon repair (s/p fall)-2013, left.  Right 2016.   TOTAL HIP ARTHROPLASTY Right 12/2019   Dr. SDelfino Lovett   Family History  Problem Relation Age of Onset   Hypertension Father    Hypertension Mother    Alzheimer's disease Mother    Celiac disease Mother    Osteoporosis Mother    Stroke Paternal Grandfather    Heart disease  Paternal Grandfather    Hypertension Paternal Grandfather    Diabetes Paternal Grandfather    Kidney disease Paternal Grandfather     Social History   Socioeconomic History   Marital status: Married    Spouse name: JAlmyra Free  Number of children: 2   Years of education: HS   Highest education level: Not on file  Occupational History    Employer: OTHER    Comment: [employer]  Tobacco Use   Smoking status: Never Smoker   Smokeless tobacco: Former [employer] developer   Types: CNurse, children'sUse: Never used  Substance and Sexual Activity   Alcohol use: Yes    Comment: socially   Drug use: No   Sexual activity: Not on file  Other Topics Concern   Not on file  Social History Narrative   Married, 2 kids.   Long time local resident, grad of [employer]     Self employed: sells parts to [employer]   No Tob.  Rare/social ETOH.  No drugs.   Social Determinants of Health   Financial Resource Strain:    Difficulty of Paying Living Expenses: Not on file  Food Insecurity:    Worried About RGarrisonin the Last Year: Not on file   RYRC Worldwideof Food in  the Last Year: Not on file  Transportation Needs:    Lack of Transportation (Medical): Not on file   Lack of Transportation (Non-Medical): Not on file  Physical Activity:    Days of Exercise per Week: Not on file   Minutes of Exercise per Session: Not on file  Stress:    Feeling of Stress : Not on file  Social Connections:    Frequency of Communication with Friends and Family: Not on file   Frequency of Social Gatherings with Friends and Family: Not on file   Attends Religious Services: Not on file   Active Member of Clubs or Organizations: Not on file   Attends Archivist Meetings: Not on file   Marital Status: Not on file  Intimate Partner Violence:    Fear of Current or Ex-Partner: Not on file   Emotionally Abused: Not on file   Physically Abused: Not on file    Sexually Abused: Not on file    Outpatient Medications Prior to Visit  Medication Sig Dispense Refill   Ascorbic Acid (VITAMIN C PO) Take by mouth daily.     cetirizine (ZYRTEC) 10 MG tablet TAKE 1 TABLET BY MOUTH EVERY DAY 90 tablet 3   ezetimibe (ZETIA) 10 MG tablet TAKE 1 TABLET BY MOUTH EVERY DAY 90 tablet 1   HYDROcodone-acetaminophen (NORCO/VICODIN) 5-325 MG tablet      lisinopril-hydrochlorothiazide (ZESTORETIC) 20-25 MG tablet TAKE 1 TABLET BY MOUTH EVERY DAY 90 tablet 0   Multiple Vitamin (MULTIVITAMIN) tablet Take 1 tablet by mouth daily.     Multiple Vitamins-Minerals (ZINC PO) Take by mouth daily.     No facility-administered medications prior to visit.    No Known Allergies  ROS Review of Systems  Constitutional: Negative for appetite change, chills, fatigue and fever.  HENT: Negative for congestion, dental problem, ear pain and sore throat.   Eyes: Negative for discharge, redness and visual disturbance.  Respiratory: Negative for cough, chest tightness, shortness of breath and wheezing.   Cardiovascular: Negative for chest pain, palpitations and leg swelling.  Gastrointestinal: Negative for abdominal pain, blood in stool, diarrhea, nausea and vomiting.  Genitourinary: Negative for difficulty urinating, dysuria, flank pain, frequency, hematuria and urgency.  Musculoskeletal: Negative for arthralgias, back pain, joint swelling, myalgias and neck stiffness.  Skin: Negative for pallor and rash.  Neurological: Negative for dizziness, speech difficulty, weakness and headaches.  Hematological: Negative for adenopathy. Does not bruise/bleed easily.  Psychiatric/Behavioral: Negative for confusion and sleep disturbance. The patient is not nervous/anxious.     PE; Vitals with BMI 06/12/2020 12/18/2019 06/08/2019  Height 5' 11"  5' 11"  5' 11"   Weight 255 lbs 13 oz 249 lbs 232 lbs 10 oz  BMI 35.69 38.45 36.46  Systolic 803 212 248  Diastolic 90 85 88  Pulse 71 65 68   Repeat manual bp at end of visit was 116/80. He did not take his bp med this morning.  Gen: Alert, well appearing.  Patient is oriented to person, place, time, and situation. AFFECT: pleasant, lucid thought and speech. ENT: Ears: EACs clear, normal epithelium.  TMs with good light reflex and landmarks bilaterally.  Eyes: no injection, icteris, swelling, or exudate.  EOMI, PERRLA. Nose: no drainage or turbinate edema/swelling.  No injection or focal lesion.  Mouth: lips without lesion/swelling.  Oral mucosa pink and moist.  Dentition intact and without obvious caries or gingival swelling.  Oropharynx without erythema, exudate, or swelling.  Neck: supple/nontender.  No LAD, mass, or TM.  Carotid  pulses 2+ bilaterally, without bruits. CV: RRR, no m/r/g.   LUNGS: CTA bilat, nonlabored resps, good aeration in all lung fields. ABD: soft, NT, ND, BS normal.  No hepatospenomegaly or mass.  No bruits. EXT: no clubbing, cyanosis, or edema.  Musculoskeletal: no joint swelling, erythema, warmth, or tenderness.  ROM of all joints intact. Skin - no sores or suspicious lesions or rashes or color changes Rectal exam: negative without mass, lesions or tenderness, PROSTATE EXAM: smooth and symmetric without nodules or tenderness.  Pertinent labs:  Lab Results  Component Value Date   TSH 1.57 06/08/2019   Lab Results  Component Value Date   WBC 7.2 12/18/2019   HGB 15.4 12/18/2019   HCT 44.7 12/18/2019   MCV 86.2 12/18/2019   PLT 193.0 12/18/2019   Lab Results  Component Value Date   CREATININE 0.95 12/18/2019   BUN 18 12/18/2019   NA 137 12/18/2019   K 4.0 12/18/2019   CL 99 12/18/2019   CO2 25 12/18/2019   Lab Results  Component Value Date   ALT 30 12/18/2019   AST 25 12/18/2019   ALKPHOS 71 12/18/2019   BILITOT 0.6 12/18/2019   Lab Results  Component Value Date   CHOL 192 12/18/2019   Lab Results  Component Value Date   HDL 41.10 12/18/2019   Lab Results  Component Value Date    LDLCALC 128 (H) 12/18/2019   Lab Results  Component Value Date   TRIG 113.0 12/18/2019   Lab Results  Component Value Date   CHOLHDL 5 12/18/2019   Lab Results  Component Value Date   PSA 0.41 06/08/2019   Lab Results  Component Value Date   HGBA1C 5.8 12/18/2019    ASSESSMENT AND PLAN:   1) HTN: The current medical regimen is effective;  continue present plan and medications. Lytes/cr today. I advised him to d/c his 37m ASA due to lack of proven benefit in primaryCV dz prevention.  2) HLD: tolerating zetia but not very compliant. He'll try to remember to take this better in future. Working gradually on dietary improvements.  3) Metabolic syndrome/class II obesity: working on diet, will work on exercise more when hips rehabilitated. HbA1c today.  4) Health maintenance exam: Reviewed age and gender appropriate health maintenance issues (prudent diet, regular exercise, health risks of tobacco and excessive alcohol, use of seatbelts, fire alarms in home, use of sunscreen).  Also reviewed age and gender appropriate health screening as well as vaccine recommendations. Vaccines: Tdap and covid UTD.  Flu->he'll get this at work this week.  Shingrix->deferred for now. Labs: fasting HP, HVBT6O(metabolic syndrome, HTN, HLD, obesity), PSA. Prostate ca screening: DRE normal today , PSA. Colon ca screening: due for initial screening->referred to GI today.  5) Preop clearance for upcoming hip arthroplasty: paperwork will be completed after labs return.  He is low risk.  No indication for any preop cardiopulm or other testing. Preop lab assessment will be done.  An After Visit Summary was printed and given to the patient.  FOLLOW UP:  Return in about 6 months (around 12/10/2020) for routine chronic illness f/u.  Signed:  PCrissie Sickles MD           06/12/2020

## 2020-06-12 NOTE — Addendum Note (Signed)
Addended by: Octaviano Glow on: 06/12/2020 09:02 AM   Modules accepted: Orders

## 2020-06-13 NOTE — Progress Notes (Signed)
LVM for CB (Lab results)

## 2020-06-18 ENCOUNTER — Other Ambulatory Visit: Payer: Self-pay | Admitting: Family Medicine

## 2020-08-20 ENCOUNTER — Encounter: Payer: Self-pay | Admitting: Family Medicine

## 2020-08-20 ENCOUNTER — Other Ambulatory Visit: Payer: Self-pay | Admitting: Family Medicine

## 2020-09-20 ENCOUNTER — Other Ambulatory Visit: Payer: 59

## 2020-09-20 ENCOUNTER — Other Ambulatory Visit: Payer: Self-pay

## 2020-09-20 DIAGNOSIS — U071 COVID-19: Secondary | ICD-10-CM

## 2020-09-20 DIAGNOSIS — Z20822 Contact with and (suspected) exposure to covid-19: Secondary | ICD-10-CM

## 2020-09-20 HISTORY — DX: COVID-19: U07.1

## 2020-09-23 ENCOUNTER — Telehealth: Payer: Self-pay | Admitting: Family Medicine

## 2020-09-23 LAB — NOVEL CORONAVIRUS, NAA: SARS-CoV-2, NAA: DETECTED — AB

## 2020-09-23 NOTE — Telephone Encounter (Signed)
Sent MyChart message with home care instructions

## 2020-09-23 NOTE — Telephone Encounter (Signed)
Patient tested positive via at home test for Covid. He would like advice for treating his symptoms at home. Please call patient to advise.

## 2020-09-23 NOTE — Telephone Encounter (Signed)
MyChart message read.

## 2020-09-24 ENCOUNTER — Encounter: Payer: Self-pay | Admitting: Family Medicine

## 2020-09-24 ENCOUNTER — Telehealth (INDEPENDENT_AMBULATORY_CARE_PROVIDER_SITE_OTHER): Payer: 59 | Admitting: Family Medicine

## 2020-09-24 DIAGNOSIS — U071 COVID-19: Secondary | ICD-10-CM | POA: Diagnosis not present

## 2020-09-24 NOTE — Progress Notes (Signed)
Virtual Visit via Video Note  I connected with Vernon Mccormick on 09/24/20 at  8:30 AM EST by a video enabled telemedicine application and verified that I am speaking with the correct person using two identifiers.  Location patient: home, [place] Location provider:work or home office Persons participating in the virtual visit: patient, provider  I discussed the limitations of evaluation and management by telemedicine and the availability of in person appointments. The patient expressed understanding and agreed to proceed.  Telemedicine visit is a necessity given the COVID-19 restrictions in place at the current time.  HPI: 55 y/o WM with HTN, HLD, obesity, and prediabetes who is being seen today for ongoing covid 19 infection symptoms.  Positive covid PCR test 09/20/20. Onset of sx's 7 d/a: nasal/sinus pressure and chest congestion, cough, really tired.  No fevers.  +HAs.  No ST but feels PND. O2 96% or better. HR typically 90s avg lately.  He is feeling a little better each of the last few days, taking otc symptomatic meds. At-home test repeat yesterday was positive.   -COVID-19 vaccine status: +received pfizer series  ROS: See pertinent positives and negatives per HPI.  Past Medical History:  Diagnosis Date  . ALLERGIC RHINITIS 05/12/2007   Qualifier: Diagnosis of  By: Scherrie Gerlach    . Elevated transaminase level 2009-2013   suspect NASH (2017)-Vernon Mccormick preferred to simply start statin and skip abd u/s.  Viral Hep serologies neg.  Marland Kitchen Hyperlipidemia    Atorva intolerant (myalgias).  Rosuva 69m started 06/2017-intol.  Zetia trial started 06/14/19  . Hypertension   . Obesity, Class II, BMI 35-39.9, with comorbidity   . Osteoarthritis of both hips    R THA planned as of 01/2020; L THA likely soon to follow  . Other malaise and fatigue 2011   Testosterone normal 2011  . Prediabetes   . Recurrent sinusitis 11/16/2011    Past Surgical History:  Procedure Laterality Date  . SHOULDER SURGERY  2013; 2016    Left rotator cuff and biceps tendon repair (s/p fall)-2013, left.  Right 2016.  .Marland KitchenTOTAL HIP ARTHROPLASTY Bilateral 12/2019; 07/2020   Staged; Left, followed by Right 6 mo later.  Dr. SDelfino Lovett    Current Outpatient Medications:  .  Ascorbic Acid (VITAMIN C PO), Take by mouth daily., Disp: , Rfl:  .  aspirin 81 MG EC tablet, Take 81 mg by mouth in the morning and at bedtime. Take one by mouth in the morning and one at night., Disp: , Rfl:  .  cetirizine (ZYRTEC) 10 MG tablet, TAKE 1 TABLET BY MOUTH EVERY DAY, Disp: 90 tablet, Rfl: 3 .  ezetimibe (ZETIA) 10 MG tablet, TAKE 1 TABLET BY MOUTH EVERY DAY, Disp: 90 tablet, Rfl: 1 .  lisinopril-hydrochlorothiazide (ZESTORETIC) 20-25 MG tablet, TAKE 1 TABLET BY MOUTH EVERY DAY, Disp: 90 tablet, Rfl: 1 .  Multiple Vitamin (MULTIVITAMIN) tablet, Take 1 tablet by mouth daily., Disp: , Rfl:  .  Multiple Vitamins-Minerals (ZINC PO), Take by mouth daily., Disp: , Rfl:   EXAM:  VITALS per patient if applicable:  GENERAL: alert, oriented, appears well and in no acute distress  HEENT: atraumatic, conjunttiva clear, no obvious abnormalities on inspection of external nose and ears  NECK: normal movements of the head and neck  LUNGS: on inspection no signs of respiratory distress, breathing rate appears normal, no obvious gross SOB, gasping or wheezing  CV: no obvious cyanosis  MS: moves all visible extremities without noticeable abnormality  PSYCH/NEURO: pleasant and cooperative,  no obvious depression or anxiety, speech and thought processing grossly intact  LABS: none today    Chemistry      Component Value Date/Time   NA 137 06/12/2020 0831   K 3.7 06/12/2020 0831   CL 100 06/12/2020 0831   CO2 28 06/12/2020 0831   BUN 21 06/12/2020 0831   CREATININE 0.98 06/12/2020 0831      Component Value Date/Time   CALCIUM 9.3 06/12/2020 0831   ALKPHOS 73 06/12/2020 0831   AST 23 06/12/2020 0831   ALT 28 06/12/2020 0831   BILITOT 0.6 06/12/2020  0831     Lab Results  Component Value Date   HGBA1C 6.1 06/12/2020   ASSESSMENT AND PLAN:  Discussed the following assessment and plan:  Covid 19 respiratory illness, vaccinated patient. Gradually improving but repeat at-home covid test is positive still.  Discussed uncertainty of what this actually means, etc. Discussed ongoing symptomatic care with mucinex dm and saline nasal spray, and quarantine precautions 3 more days makes sense. Signs/symptoms to call or return for were reviewed and Vernon Mccormick expressed understanding.  I discussed the assessment and treatment plan with the patient. The patient was provided an opportunity to ask questions and all were answered. The patient agreed with the plan and demonstrated an understanding of the instructions.   F/u: if not improving  Signed:  Crissie Sickles, MD           09/24/2020

## 2020-10-02 ENCOUNTER — Encounter: Payer: Self-pay | Admitting: Family Medicine

## 2020-12-02 ENCOUNTER — Telehealth: Payer: Self-pay | Admitting: Family Medicine

## 2020-12-02 NOTE — Telephone Encounter (Signed)
Spoke with patient to advise of instructions. Pt voiced understanding.

## 2020-12-02 NOTE — Telephone Encounter (Signed)
Patient has appointment tomorrow morning (12/03/20). He wants to know what chronic illness it is for and if he needs to be fasting. Please call patient to advise.

## 2020-12-02 NOTE — Telephone Encounter (Signed)
LM for pt to return call regarding appt. The appt for tomorrow is to follow up for blood pressure and cholesterol. Yes he should be fasting for the appointment. Nothing to eat or drink except water or black coffee after midnight

## 2020-12-03 ENCOUNTER — Other Ambulatory Visit: Payer: Self-pay

## 2020-12-03 ENCOUNTER — Encounter: Payer: Self-pay | Admitting: Family Medicine

## 2020-12-03 ENCOUNTER — Ambulatory Visit: Payer: 59 | Admitting: Family Medicine

## 2020-12-03 VITALS — BP 124/84 | HR 87 | Temp 97.6°F | Resp 16 | Ht 71.0 in | Wt 261.0 lb

## 2020-12-03 DIAGNOSIS — I10 Essential (primary) hypertension: Secondary | ICD-10-CM | POA: Diagnosis not present

## 2020-12-03 DIAGNOSIS — E7800 Pure hypercholesterolemia, unspecified: Secondary | ICD-10-CM

## 2020-12-03 DIAGNOSIS — R7303 Prediabetes: Secondary | ICD-10-CM

## 2020-12-03 LAB — LIPID PANEL
Cholesterol: 192 mg/dL (ref 0–200)
HDL: 40.8 mg/dL (ref >39.00)
LDL Cholesterol: 122 mg/dL — ABNORMAL HIGH (ref 0–99)
NonHDL: 151.2
Total CHOL/HDL Ratio: 5
Triglycerides: 147 mg/dL (ref 0.0–149.0)
VLDL: 29.4 mg/dL (ref 0.0–40.0)

## 2020-12-03 LAB — HEMOGLOBIN A1C: Hgb A1c MFr Bld: 6.3 % (ref 4.6–6.5)

## 2020-12-03 LAB — COMPREHENSIVE METABOLIC PANEL
ALT: 43 U/L (ref 0–53)
AST: 30 U/L (ref 0–37)
Albumin: 4.5 g/dL (ref 3.5–5.2)
Alkaline Phosphatase: 85 U/L (ref 39–117)
BUN: 13 mg/dL (ref 6–23)
CO2: 28 mEq/L (ref 19–32)
Calcium: 9.4 mg/dL (ref 8.4–10.5)
Chloride: 98 mEq/L (ref 96–112)
Creatinine, Ser: 0.98 mg/dL (ref 0.40–1.50)
GFR: 88.79 mL/min (ref >60.00)
Glucose, Bld: 100 mg/dL — ABNORMAL HIGH (ref 70–99)
Potassium: 3.9 mEq/L (ref 3.5–5.1)
Sodium: 135 mEq/L (ref 135–145)
Total Bilirubin: 0.4 mg/dL (ref 0.2–1.2)
Total Protein: 6.9 g/dL (ref 6.0–8.3)

## 2020-12-03 MED ORDER — METOPROLOL SUCCINATE ER 25 MG PO TB24: 25.0000 mg | ORAL_TABLET | Freq: Every day | ORAL | 1 refills | Status: DC

## 2020-12-03 MED ORDER — EZETIMIBE 10 MG PO TABS: 10.0000 mg | ORAL_TABLET | Freq: Every day | ORAL | 3 refills | Status: DC

## 2020-12-03 MED ORDER — LISINOPRIL-HYDROCHLOROTHIAZIDE 20-25 MG PO TABS: 1.0000 | ORAL_TABLET | Freq: Every day | ORAL | 3 refills | Status: DC

## 2020-12-03 NOTE — Patient Instructions (Signed)
Check your blood pressure and heart rate once a day and write these numbers down to review with me at follow up visit in about 2 weeks.

## 2020-12-03 NOTE — Progress Notes (Signed)
OFFICE VISIT  12/03/2020  CC:  Chief Complaint  Patient presents with  . Follow-up    RCI, pt is fasting    HPI:    Patient is a 55 y.o. Caucasian male who presents for 6 mo f/u HTN, HLD, prediabetes. A/P as of last visit: "1) HTN: The current medical regimen is effective;  continue present plan and medications. Lytes/cr today. I advised him to d/c his 63m ASA due to lack of proven benefit in primaryCV dz prevention.  2) HLD: tolerating zetia but not very compliant. He'll try to remember to take this better in future. Working gradually on dietary improvements.  3) Metabolic syndrome/class II obesity: working on diet, will work on exercise more when hips rehabilitated. HbA1c today.  4) Health maintenance exam: Reviewed age and gender appropriate health maintenance issues (prudent diet, regular exercise, health risks of tobacco and excessive alcohol, use of seatbelts, fire alarms in home, use of sunscreen).  Also reviewed age and gender appropriate health screening as well as vaccine recommendations. Vaccines: Tdap and covid UTD.  Flu->he'll get this at work this week.  Shingrix->deferred for now. Labs: fasting HP, HGYF7C(metabolic syndrome, HTN, HLD, obesity), PSA. Prostate ca screening: DRE normal today , PSA. Colon ca screening: due for initial screening->referred to GI today.  5) Preop clearance for upcoming hip arthroplasty: paperwork will be completed after labs return.  He is low risk.  No indication for any preop cardiopulm or other testing. Preop lab assessment will be done."   INTERIM HX: Doing fine.  Had covid 09/2020, took him a while to get over fatigue. Occ outside bps <130 syst, diast in upper 80s.   Compliant with lisin-hct 20-25. Taking zetia qd now,says he's much more compliant with this med since I last saw him. Looking forward to getting released to "full" activity by ortho, s/p hip arthroplasties 2021. He is trying to be aware of carb/fat intake. Wt  is up  Some mild nasal sx's lately consistent with seasonal allergies.   Past Medical History:  Diagnosis Date  . ALLERGIC RHINITIS 05/12/2007   Qualifier: Diagnosis of  By: TScherrie Gerlach   . COVID-19 virus infection 09/20/2020  . Elevated transaminase level 2009-2013   suspect NASH (2017)-pt preferred to simply start statin and skip abd u/s.  Viral Hep serologies neg.  .Marland KitchenHyperlipidemia    Atorva intolerant (myalgias).  Rosuva 174mstarted 06/2017-intol.  Zetia trial started 06/14/19  . Hypertension   . Obesity, Class II, BMI 35-39.9, with comorbidity   . Osteoarthritis of both hips    Staged bilat THA 2021  . Other malaise and fatigue 2011   Testosterone normal 2011  . Prediabetes   . Recurrent sinusitis 11/16/2011    Past Surgical History:  Procedure Laterality Date  . SHOULDER SURGERY  2013; 2016   Left rotator cuff and biceps tendon repair (s/p fall)-2013, left.  Right 2016.  . Marland KitchenOTAL HIP ARTHROPLASTY Bilateral 12/2019; 07/2020   Staged; Left, followed by Right 6 mo later.  Dr. SwDelfino Lovett  Outpatient Medications Prior to Visit  Medication Sig Dispense Refill  . Ascorbic Acid (VITAMIN C PO) Take by mouth daily.    . Marland Kitchenspirin 81 MG EC tablet Take 81 mg by mouth daily. Take one by mouth once daily.    . cetirizine (ZYRTEC) 10 MG tablet TAKE 1 TABLET BY MOUTH EVERY DAY 90 tablet 3  . Multiple Vitamin (MULTIVITAMIN) tablet Take 1 tablet by mouth daily.    . Multiple  Vitamins-Minerals (ZINC PO) Take by mouth daily.    Marland Kitchen ezetimibe (ZETIA) 10 MG tablet TAKE 1 TABLET BY MOUTH EVERY DAY 90 tablet 1  . lisinopril-hydrochlorothiazide (ZESTORETIC) 20-25 MG tablet TAKE 1 TABLET BY MOUTH EVERY DAY 90 tablet 1   No facility-administered medications prior to visit.    No Known Allergies  ROS As per HPI  PE: Vitals with BMI 12/03/2020 06/12/2020 12/18/2019  Height 5' 11"  5' 11"  5' 11"   Weight 261 lbs 255 lbs 13 oz 249 lbs  BMI 36.42 45.40 98.11  Systolic 914 782 956  Diastolic 84 90  85  Pulse 87 71 65     Gen: Alert, well appearing.  Patient is oriented to person, place, time, and situation. AFFECT: pleasant, lucid thought and speech. CV: RRR (rate 75 on my exam), no m/r/g.   LUNGS: CTA bilat, nonlabored resps, good aeration in all lung fields. EXT: no clubbing or cyanosis.  no edema.    LABS:  Lab Results  Component Value Date   TSH 2.34 06/12/2020   Lab Results  Component Value Date   WBC 5.6 06/12/2020   HGB 14.2 06/12/2020   HCT 42.4 06/12/2020   MCV 85.0 06/12/2020   PLT 271.0 06/12/2020   Lab Results  Component Value Date   CREATININE 0.98 06/12/2020   BUN 21 06/12/2020   NA 137 06/12/2020   K 3.7 06/12/2020   CL 100 06/12/2020   CO2 28 06/12/2020   Lab Results  Component Value Date   ALT 28 06/12/2020   AST 23 06/12/2020   ALKPHOS 73 06/12/2020   BILITOT 0.6 06/12/2020   Lab Results  Component Value Date   CHOL 203 (H) 06/12/2020   Lab Results  Component Value Date   HDL 40.20 06/12/2020   Lab Results  Component Value Date   LDLCALC 128 (H) 06/12/2020   Lab Results  Component Value Date   TRIG 173.0 (H) 06/12/2020   Lab Results  Component Value Date   CHOLHDL 5 06/12/2020   Lab Results  Component Value Date   PSA 0.34 06/12/2020   PSA 0.41 06/08/2019   Lab Results  Component Value Date   HGBA1C 6.1 06/12/2020   IMPRESSION AND PLAN:  1) HTN, not ideal control. Add toprol xl 8m qd. Cont lisin-hct 20-25 qd. Lytes/cr today. Monitor home bp/hr daily and we'll review these in 2 wks.  2) HLD: statin intol.   Tolerating zetia, compliant with this med now. Working on diet.  Needs to inc exercise now that he's getting out of the post-op timeframe from his hip surgeries. Lipid panel and hepatic panel today.  3) Prediabetes: TLC discussed. Hba1c and fasting glucose today.  An After Visit Summary was printed and given to the patient.  FOLLOW UP: Return in about 2 weeks (around 12/17/2020) for f/u HTN.  Signed:   PCrissie Sickles MD           12/03/2020

## 2020-12-16 ENCOUNTER — Ambulatory Visit (INDEPENDENT_AMBULATORY_CARE_PROVIDER_SITE_OTHER): Payer: 59 | Admitting: Family Medicine

## 2020-12-16 ENCOUNTER — Other Ambulatory Visit: Payer: Self-pay

## 2020-12-16 ENCOUNTER — Encounter: Payer: Self-pay | Admitting: Family Medicine

## 2020-12-16 VITALS — BP 134/84 | HR 65 | Temp 97.2°F | Ht 71.0 in | Wt 263.6 lb

## 2020-12-16 DIAGNOSIS — I10 Essential (primary) hypertension: Secondary | ICD-10-CM | POA: Diagnosis not present

## 2020-12-16 DIAGNOSIS — R7303 Prediabetes: Secondary | ICD-10-CM

## 2020-12-16 MED ORDER — METFORMIN HCL 500 MG PO TABS: ORAL_TABLET | ORAL | 6 refills | Status: DC

## 2020-12-16 NOTE — Progress Notes (Signed)
OFFICE VISIT  12/16/2020  CC:  Chief Complaint  Patient presents with  . Follow-up    HTN    HPI:    Patient is a 55 y.o. Caucasian male who presents for 2 week f/u uncontrolled HTN. A/P as of last visit: "1) HTN, not ideal control. Add toprol xl 101m qd. Cont lisin-hct 20-25 qd. Lytes/cr today. Monitor home bp/hr daily and we'll review these in 2 wks.  2) HLD: statin intol.   Tolerating zetia, compliant with this med now. Working on diet.  Needs to inc exercise now that he's getting out of the post-op timeframe from his hip surgeries. Lipid panel and hepatic panel today.  3) Prediabetes: TLC discussed. Hba1c and fasting glucose today."  INTERIM HX: Feeling well. Syst 120s, diast avg 80.  Highest diast 87.  HR 65-75.   Prediabetes: his a1c was up to 6.3% last visit a couple weeks ago and I recommended he start metformin but he declined. States he checked his gluc some since last visit and is getting 90s-125 range fasting and random.  Past Medical History:  Diagnosis Date  . ALLERGIC RHINITIS 05/12/2007   Qualifier: Diagnosis of  By: TScherrie Gerlach   . COVID-19 virus infection 09/20/2020  . Elevated transaminase level 2009-2013   suspect NASH (2017)-pt preferred to simply start statin and skip abd u/s.  Viral Hep serologies neg.  .Marland KitchenHyperlipidemia    Atorva intolerant (myalgias).  Rosuva 127mstarted 06/2017-intol.  pt tolerates zetia  . Hypertension   . Obesity, Class II, BMI 35-39.9, with comorbidity   . Osteoarthritis of both hips    Staged bilat THA 2021  . Other malaise and fatigue 2011   Testosterone normal 2011  . Prediabetes   . Recurrent sinusitis 11/16/2011    Past Surgical History:  Procedure Laterality Date  . SHOULDER SURGERY  2013; 2016   Left rotator cuff and biceps tendon repair (s/p fall)-2013, left.  Right 2016.  . Marland KitchenOTAL HIP ARTHROPLASTY Bilateral 12/2019; 07/2020   Staged; Left, followed by Right 6 mo later.  Dr. SwDelfino Lovett  Outpatient  Medications Prior to Visit  Medication Sig Dispense Refill  . Ascorbic Acid (VITAMIN C PO) Take by mouth daily.    . Marland Kitchenspirin 81 MG EC tablet Take 81 mg by mouth daily. Take one by mouth once daily.    . cetirizine (ZYRTEC) 10 MG tablet TAKE 1 TABLET BY MOUTH EVERY DAY 90 tablet 3  . ezetimibe (ZETIA) 10 MG tablet Take 1 tablet (10 mg total) by mouth daily. 90 tablet 3  . lisinopril-hydrochlorothiazide (ZESTORETIC) 20-25 MG tablet Take 1 tablet by mouth daily. 90 tablet 3  . metoprolol succinate (TOPROL-XL) 25 MG 24 hr tablet Take 1 tablet (25 mg total) by mouth daily. 30 tablet 1  . Multiple Vitamin (MULTIVITAMIN) tablet Take 1 tablet by mouth daily.    . Multiple Vitamins-Minerals (ZINC PO) Take by mouth daily.     No facility-administered medications prior to visit.    No Known Allergies  ROS As per HPI  PE: Vitals with BMI 12/16/2020 12/03/2020 06/12/2020  Height 5' 11"  5' 11"  5' 11"   Weight 263 lbs 10 oz 261 lbs 255 lbs 13 oz  BMI 36.78 [id number] [id number] 84 84 90  Pulse 65 87 71     Gen: Alert, well appearing.  Patient is oriented to person, place, time, and situation. AFFECT: pleasant, lucid thought and speech. CV: RRR (rate 70  by me), no m/r/g.   LUNGS: CTA bilat, nonlabored resps, good aeration in all lung fields.   LABS:  Lab Results  Component Value Date   TSH 2.34 06/12/2020   Lab Results  Component Value Date   WBC 5.6 06/12/2020   HGB 14.2 06/12/2020   HCT 42.4 06/12/2020   MCV 85.0 06/12/2020   PLT 271.0 06/12/2020   Lab Results  Component Value Date   CREATININE 0.98 12/03/2020   BUN 13 12/03/2020   NA 135 12/03/2020   K 3.9 12/03/2020   CL 98 12/03/2020   CO2 28 12/03/2020   Lab Results  Component Value Date   ALT 43 12/03/2020   AST 30 12/03/2020   ALKPHOS 85 12/03/2020   BILITOT 0.4 12/03/2020   Lab Results  Component Value Date   CHOL 192 12/03/2020   Lab Results  Component Value Date   HDL 40.80  12/03/2020   Lab Results  Component Value Date   LDLCALC 122 (H) 12/03/2020   Lab Results  Component Value Date   TRIG 147.0 12/03/2020   Lab Results  Component Value Date   CHOLHDL 5 12/03/2020   Lab Results  Component Value Date   PSA 0.34 06/12/2020   PSA 0.41 06/08/2019   Lab Results  Component Value Date   HGBA1C 6.3 12/03/2020   IMPRESSION AND PLAN:  1) HTN: good control on toprol xl 17m qd and lisin-hct 20-25 qd. No labs needed today.  2) Prediabetes: discussed diet/exercise/wt loss + start low dose metformin as general approach to minimizing risk of progression to DM 2.  Pt agreeable to trial of metformin 500 mg qd and we'll recheck in office/hba1c in 3 mo.  Therapeutic expectations and side effect profile of medication discussed today.  Patient's questions answered.  An After Visit Summary was printed and given to the patient.  FOLLOW UP: Return in about 3 months (around 03/17/2021) for f/u prediab and HTN.  Signed:  PCrissie Sickles MD           12/16/2020

## 2020-12-25 ENCOUNTER — Other Ambulatory Visit: Payer: Self-pay | Admitting: Family Medicine

## 2021-01-08 ENCOUNTER — Encounter: Payer: Self-pay | Admitting: Family Medicine

## 2021-01-09 NOTE — Telephone Encounter (Signed)
Can be taken any time of day but just try to take it at the same time every day (most people find it easiest to take it every night with supper).

## 2021-03-25 ENCOUNTER — Encounter: Payer: Self-pay | Admitting: Family Medicine

## 2021-03-25 ENCOUNTER — Other Ambulatory Visit: Payer: Self-pay

## 2021-03-25 ENCOUNTER — Ambulatory Visit (INDEPENDENT_AMBULATORY_CARE_PROVIDER_SITE_OTHER): Payer: 59 | Admitting: Family Medicine

## 2021-03-25 VITALS — BP 133/85 | HR 58 | Temp 97.4°F | Resp 16 | Ht 71.0 in | Wt 263.4 lb

## 2021-03-25 DIAGNOSIS — E7800 Pure hypercholesterolemia, unspecified: Secondary | ICD-10-CM

## 2021-03-25 DIAGNOSIS — R7303 Prediabetes: Secondary | ICD-10-CM | POA: Diagnosis not present

## 2021-03-25 DIAGNOSIS — I10 Essential (primary) hypertension: Secondary | ICD-10-CM | POA: Diagnosis not present

## 2021-03-25 LAB — BASIC METABOLIC PANEL
BUN: 14 mg/dL (ref 6–23)
CO2: 29 mEq/L (ref 19–32)
Calcium: 9.7 mg/dL (ref 8.4–10.5)
Chloride: 98 mEq/L (ref 96–112)
Creatinine, Ser: 1.03 mg/dL (ref 0.40–1.50)
GFR: 83.46 mL/min (ref >60.00)
Glucose, Bld: 91 mg/dL (ref 70–99)
Potassium: 4.1 mEq/L (ref 3.5–5.1)
Sodium: 136 mEq/L (ref 135–145)

## 2021-03-25 LAB — HEMOGLOBIN A1C: Hgb A1c MFr Bld: 6.3 % (ref 4.6–6.5)

## 2021-03-25 NOTE — Progress Notes (Signed)
OFFICE VISIT  03/25/2021  CC:  Chief Complaint  Patient presents with   Follow-up    RCI, 3 mo. Pt is fasting   HPI:    Patient is a 55 y.o. Caucasian male who presents for 3 mo f/u prediabetes, HTN, and HLD. A/P as of last visit: "1) HTN: good control on toprol xl 79m qd and lisin-hct 20-25 qd. No labs needed today.   2) Prediabetes: discussed diet/exercise/wt loss + start low dose metformin as general approach to minimizing risk of progression to DM 2.  Pt agreeable to trial of metformin 500 mg qd and we'll recheck in office/hba1c in 3 mo.  "  INTERIM HX: Feeling well. Tolerating metformin. Says he has cut back on carbs and eating smaller portion sizes. Walks 3 d/week.    Home bp's <130/80 consistently.  Still taking zetia 183mqd w/out side effect. He is fasting today.  ROS as above, plus--> no fevers, no CP, no SOB, no wheezing, no cough, no dizziness, no HAs, no rashes, no melena/hematochezia.  No polyuria or polydipsia.  No myalgias or arthralgias.  No focal weakness, paresthesias, or tremors.  No acute vision or hearing abnormalities.  No dysuria or unusual/new urinary urgency or frequency.  No recent changes in lower legs. No n/v/d or abd pain.  No palpitations.    Past Medical History:  Diagnosis Date   ALLERGIC RHINITIS 05/12/2007   Qualifier: Diagnosis of  By: ToSherren MochaRN, Ellen     COVID-19 virus infection 09/20/2020   Elevated transaminase level 2009-2013   suspect NASH (2017)-pt preferred to simply start statin and skip abd u/s.  Viral Hep serologies neg.   Hyperlipidemia    Atorva intolerant (myalgias).  Rosuva 1036mtarted 06/2017-intol.  pt tolerates zetia   Hypertension    Obesity, Class II, BMI 35-39.9, with comorbidity    Osteoarthritis of both hips    Staged bilat THA 2021   Other malaise and fatigue 2011   Testosterone normal 2011   Prediabetes    Recurrent sinusitis 11/16/2011    Past Surgical History:  Procedure Laterality Date   SHOULDER  SURGERY  2013; 2016   Left rotator cuff and biceps tendon repair (s/p fall)-2013, left.  Right 2016.   TOTAL HIP ARTHROPLASTY Bilateral 12/2019; 07/2020   Staged; Left, followed by Right 6 mo later.  Dr. SwiDelfino Lovett Outpatient Medications Prior to Visit  Medication Sig Dispense Refill   Ascorbic Acid (VITAMIN C PO) Take by mouth daily.     cetirizine (ZYRTEC) 10 MG tablet TAKE 1 TABLET BY MOUTH EVERY DAY 90 tablet 3   ezetimibe (ZETIA) 10 MG tablet Take 1 tablet (10 mg total) by mouth daily. 90 tablet 3   lisinopril-hydrochlorothiazide (ZESTORETIC) 20-25 MG tablet Take 1 tablet by mouth daily. 90 tablet 3   metFORMIN (GLUCOPHAGE) 500 MG tablet 1 tab po qd with supper 30 tablet 6   metoprolol succinate (TOPROL-XL) 25 MG 24 hr tablet TAKE 1 TABLET (25 MG TOTAL) BY MOUTH DAILY. 30 tablet 5   Multiple Vitamin (MULTIVITAMIN) tablet Take 1 tablet by mouth daily.     Multiple Vitamins-Minerals (ZINC PO) Take by mouth daily.     aspirin 81 MG EC tablet Take 81 mg by mouth daily. Take one by mouth once daily. (Patient not taking: Reported on 03/25/2021)     No facility-administered medications prior to visit.    No Known Allergies  ROS As per HPI  PE: Vitals with BMI 03/25/2021 12/16/2020 12/03/2020  Height 5' 11"  5' 11"  5' 11"   Weight 263 lbs 6 oz 263 lbs 10 oz 261 lbs  BMI 36.75 09.23 30.07  Systolic 622 633 354  Diastolic 85 84 84  Pulse 58 65 87   Gen: Alert, well appearing.  Patient is oriented to person, place, time, and situation. AFFECT: pleasant, lucid thought and speech. CV: RRR, no m/r/g.   LUNGS: CTA bilat, nonlabored resps, good aeration in all lung fields. EXT: no clubbing or cyanosis.  no edema.    LABS:  Lab Results  Component Value Date   TSH 2.34 06/12/2020   Lab Results  Component Value Date   WBC 5.6 06/12/2020   HGB 14.2 06/12/2020   HCT 42.4 06/12/2020   MCV 85.0 06/12/2020   PLT 271.0 06/12/2020   Lab Results  Component Value Date   CREATININE 0.98  12/03/2020   BUN 13 12/03/2020   NA 135 12/03/2020   K 3.9 12/03/2020   CL 98 12/03/2020   CO2 28 12/03/2020   Lab Results  Component Value Date   ALT 43 12/03/2020   AST 30 12/03/2020   ALKPHOS 85 12/03/2020   BILITOT 0.4 12/03/2020   Lab Results  Component Value Date   CHOL 192 12/03/2020   Lab Results  Component Value Date   HDL 40.80 12/03/2020   Lab Results  Component Value Date   LDLCALC 122 (H) 12/03/2020   Lab Results  Component Value Date   TRIG 147.0 12/03/2020   Lab Results  Component Value Date   CHOLHDL 5 12/03/2020   Lab Results  Component Value Date   PSA 0.34 06/12/2020   PSA 0.41 06/08/2019   Lab Results  Component Value Date   HGBA1C 6.3 12/03/2020   IMPRESSION AND PLAN:  1) Prediabetes: tolerating metformin 500 qd started 3 mo ago. Doing well with diet, trying to inc exercise, pt frustrated that he still continues to gain weight slowly. Rpt a1c/fasting sugar today. He requests endocrinology referral so I ordered this referral today.  2) HTN: stable on toprol x 25 qd and lisin-hctz 20-25 qd. BMET today.  3) HLD: tolerating zetia (statin intol).   LDL stable at 122 3 mo ago->would like LDL around 100 or better. Plan rpt lipids and hepatic panel in 3 mo.  An After Visit Summary was printed and given to the patient.  FOLLOW UP: Return in about 3 months (around 06/25/2021) for annual CPE (fasting) +f/u RCI.  Signed:  Crissie Sickles, MD           03/25/2021

## 2021-05-02 ENCOUNTER — Other Ambulatory Visit: Payer: Self-pay

## 2021-05-02 ENCOUNTER — Ambulatory Visit (INDEPENDENT_AMBULATORY_CARE_PROVIDER_SITE_OTHER): Payer: 59 | Admitting: Endocrinology

## 2021-05-02 VITALS — BP 138/80 | HR 72 | Ht 71.0 in | Wt 258.4 lb

## 2021-05-02 DIAGNOSIS — E669 Obesity, unspecified: Secondary | ICD-10-CM

## 2021-05-02 DIAGNOSIS — E139 Other specified diabetes mellitus without complications: Secondary | ICD-10-CM

## 2021-05-02 DIAGNOSIS — E1165 Type 2 diabetes mellitus with hyperglycemia: Secondary | ICD-10-CM | POA: Diagnosis not present

## 2021-05-02 DIAGNOSIS — E119 Type 2 diabetes mellitus without complications: Secondary | ICD-10-CM

## 2021-05-02 LAB — POCT GLYCOSYLATED HEMOGLOBIN (HGB A1C): Hemoglobin A1C: 5.8 % — AB (ref 4.0–5.6)

## 2021-05-02 MED ORDER — RYBELSUS 3 MG PO TABS: 3.0000 mg | ORAL_TABLET | Freq: Every day | ORAL | 3 refills | Status: DC

## 2021-05-02 NOTE — Progress Notes (Signed)
Subjective:    Patient ID: Vernon Mccormick, male    DOB: [birth year], 55 y.o.   MRN: [id number]  HPI pt is referred by Dr Anitra Lauth, for diabetes.  Pt states DM was dx'ed in 2022; he is unaware of any chronic complications; he has never been on insulin; he was rx'ed metformin in early 2022; pt says his diet and exercise are fair; he has never had pancreatitis, pancreatic surgery, severe hypoglycemia or DKA.   Past Medical History:  Diagnosis Date   ALLERGIC RHINITIS 05/12/2007   Qualifier: Diagnosis of  By: Sherren Mocha, RN, Ellen     COVID-19 virus infection 09/20/2020   Elevated transaminase level 2009-2013   suspect NASH (2017)-pt preferred to simply start statin and skip abd u/s.  Viral Hep serologies neg.   Hyperlipidemia    Atorva intolerant (myalgias).  Rosuva 98m started 06/2017-intol.  pt tolerates zetia   Hypertension    Obesity, Class II, BMI 35-39.9, with comorbidity    Osteoarthritis of both hips    Staged bilat THA 2021   Other malaise and fatigue 2011   Testosterone normal 2011   Prediabetes    Recurrent sinusitis 11/16/2011    Past Surgical History:  Procedure Laterality Date   SHOULDER SURGERY  2013; 2016   Left rotator cuff and biceps tendon repair (s/p fall)-2013, left.  Right 2016.   TOTAL HIP ARTHROPLASTY Bilateral 12/2019; 07/2020   Staged; Left, followed by Right 6 mo later.  Dr. SDelfino Lovett   Social History   Socioeconomic History   Marital status: Married    Spouse name: JAlmyra Free  Number of children: 2   Years of education: HS   Highest education level: Not on file  Occupational History    Employer: OTHER    Comment: [employer]  Tobacco Use   Smoking status: Never   Smokeless tobacco: Former    Types: CNurse, children'sUse: Never used  Substance and Sexual Activity   Alcohol use: Yes    Comment: socially   Drug use: No   Sexual activity: Not on file  Other Topics Concern   Not on file  Social History Narrative   Married, 2 kids.   [place] time  local resident, [place] [employer]     Self employed: sells parts to [employer]   No Tob.  Rare/social ETOH.  No drugs.   Social Determinants of Health   Financial Resource Strain: Not on file  Food Insecurity: Not on file  Transportation Needs: Not on file  Physical Activity: Not on file  Stress: Not on file  Social Connections: Not on file  Intimate Partner Violence: Not on file    Current Outpatient Medications on File Prior to Visit  Medication Sig Dispense Refill   Ascorbic Acid (VITAMIN C PO) Take by mouth daily.     cetirizine (ZYRTEC) 10 MG tablet TAKE 1 TABLET BY MOUTH EVERY DAY 90 tablet 3   ezetimibe (ZETIA) 10 MG tablet Take 1 tablet (10 mg total) by mouth daily. 90 tablet 3   lisinopril-hydrochlorothiazide (ZESTORETIC) 20-25 MG tablet Take 1 tablet by mouth daily. 90 tablet 3   metFORMIN (GLUCOPHAGE) 500 MG tablet 1 tab po qd with supper 30 tablet 6   metoprolol succinate (TOPROL-XL) 25 MG 24 hr tablet TAKE 1 TABLET (25 MG TOTAL) BY MOUTH DAILY. 30 tablet 5   Multiple Vitamin (MULTIVITAMIN) tablet Take 1 tablet by mouth daily.     Multiple Vitamins-Minerals (ZINC  PO) Take by mouth daily.     No current facility-administered medications on file prior to visit.    No Known Allergies  Family History  Problem Relation Age of Onset   Hypertension Father    Hypertension Mother    Alzheimer's disease Mother    Celiac disease Mother    Osteoporosis Mother    Stroke Paternal Grandfather    Heart disease Paternal Grandfather    Hypertension Paternal Grandfather    Diabetes Paternal Grandfather    Kidney disease Paternal Grandfather     BP 138/80 (BP Location: Right Arm, Patient Position: Sitting, Cuff Size: Large)   Pulse 72   Ht 5' 11"  (1.803 m)   Wt 258 lb 6.4 oz (117.2 kg)   SpO2 97%   BMI 36.04 kg/m    Review of Systems denies weight loss, sob, n/v, and depression.     Objective:   Physical Exam Pulses: dorsalis pedis intact bilat.    MSK: no deformity of the feet.   CV: trace bilat leg edema.  Skin:  no ulcer on the feet.  normal color and temp on the feet.   Neuro: sensation is intact to touch on the feet.   Ext: there is bilateral onychomycosis of the toenails.   Lab Results  Component Value Date   CREATININE 1.03 03/25/2021   BUN 14 03/25/2021   NA 136 03/25/2021   K 4.1 03/25/2021   CL 98 03/25/2021   CO2 29 03/25/2021    Lab Results  Component Value Date   HGBA1C 6.3 03/25/2021   I have reviewed outside records, and summarized: Pt was noted to have elevated A1c, and referred here.  A1c was well-controlled, but pt requested to see specialist.     Assessment & Plan:  Type 2 DM Obesity: uncontrolled.  We discussed options.  He requests to add GLP.   Patient Instructions  good diet and exercise significantly improve the control of your diabetes.  please let me know if you wish to be referred to a dietician.  high blood sugar is very risky to your health.  you should see an eye doctor and dentist every year.  It is very important to get all recommended vaccinations.  Controlling your blood pressure and cholesterol drastically reduces the damage diabetes does to your body.  Those who smoke should quit.  Please discuss these with your doctor.  I have sent a prescription to your pharmacy, to add "Rybelsus." Please continue the same metformin. Please call if you have nausea or heartburn, please call so we can change the metformin to extended-release.   check your blood sugar twice a week.  vary the time of day when you check, between before the 3 meals, and at bedtime.  also check if you have symptoms of your blood sugar being too high or too low.  please keep a record of the readings and bring it to your next appointment here (or you can bring the meter itself).  You can write it on any piece of paper.  please call us sooner if your blood sugar goes below 70, or if most of your readings are over 200.   Please  come back for a follow-up appointment in 3-4 months.

## 2021-05-02 NOTE — Patient Instructions (Addendum)
good diet and exercise significantly improve the control of your diabetes.  please let me know if you wish to be referred to a dietician.  high blood sugar is very risky to your health.  you should see an eye doctor and dentist every year.  It is very important to get all recommended vaccinations.  Controlling your blood pressure and cholesterol drastically reduces the damage diabetes does to your body.  Those who smoke should quit.  Please discuss these with your doctor.  I have sent a prescription to your pharmacy, to add "Rybelsus." Please continue the same metformin. Please call if you have nausea or heartburn, please call so we can change the metformin to extended-release.   check your blood sugar twice a week.  vary the time of day when you check, between before the 3 meals, and at bedtime.  also check if you have symptoms of your blood sugar being too high or too low.  please keep a record of the readings and bring it to your next appointment here (or you can bring the meter itself).  You can write it on any piece of paper.  please call us sooner if your blood sugar goes below 70, or if most of your readings are over 200.   Please come back for a follow-up appointment in 3-4 months.

## 2021-05-03 DIAGNOSIS — E119 Type 2 diabetes mellitus without complications: Secondary | ICD-10-CM | POA: Insufficient documentation

## 2021-06-06 ENCOUNTER — Other Ambulatory Visit: Payer: Self-pay | Admitting: Family Medicine

## 2021-06-25 ENCOUNTER — Other Ambulatory Visit: Payer: Self-pay

## 2021-06-25 ENCOUNTER — Ambulatory Visit (INDEPENDENT_AMBULATORY_CARE_PROVIDER_SITE_OTHER): Payer: 59 | Admitting: Family Medicine

## 2021-06-25 ENCOUNTER — Encounter: Payer: Self-pay | Admitting: Family Medicine

## 2021-06-25 VITALS — BP 130/85 | HR 60 | Temp 97.9°F | Ht 71.0 in | Wt 261.0 lb

## 2021-06-25 DIAGNOSIS — R7303 Prediabetes: Secondary | ICD-10-CM | POA: Diagnosis not present

## 2021-06-25 DIAGNOSIS — Z125 Encounter for screening for malignant neoplasm of prostate: Secondary | ICD-10-CM

## 2021-06-25 DIAGNOSIS — Z0000 Encounter for general adult medical examination without abnormal findings: Secondary | ICD-10-CM | POA: Diagnosis not present

## 2021-06-25 DIAGNOSIS — I10 Essential (primary) hypertension: Secondary | ICD-10-CM

## 2021-06-25 DIAGNOSIS — E7800 Pure hypercholesterolemia, unspecified: Secondary | ICD-10-CM | POA: Diagnosis not present

## 2021-06-25 DIAGNOSIS — Z1211 Encounter for screening for malignant neoplasm of colon: Secondary | ICD-10-CM | POA: Diagnosis not present

## 2021-06-25 LAB — COMPREHENSIVE METABOLIC PANEL
ALT: 58 U/L — ABNORMAL HIGH (ref 0–53)
AST: 38 U/L — ABNORMAL HIGH (ref 0–37)
Albumin: 4.5 g/dL (ref 3.5–5.2)
Alkaline Phosphatase: 70 U/L (ref 39–117)
BUN: 15 mg/dL (ref 6–23)
CO2: 29 mEq/L (ref 19–32)
Calcium: 9.6 mg/dL (ref 8.4–10.5)
Chloride: 99 mEq/L (ref 96–112)
Creatinine, Ser: 1.02 mg/dL (ref 0.40–1.50)
GFR: 84.29 mL/min (ref >60.00)
Glucose, Bld: 92 mg/dL (ref 70–99)
Potassium: 4.1 mEq/L (ref 3.5–5.1)
Sodium: 137 mEq/L (ref 135–145)
Total Bilirubin: 0.5 mg/dL (ref 0.2–1.2)
Total Protein: 6.9 g/dL (ref 6.0–8.3)

## 2021-06-25 LAB — CBC WITH DIFFERENTIAL/PLATELET
Basophils Absolute: 0.1 10*3/uL (ref 0.0–0.1)
Basophils Relative: 0.8 % (ref 0.0–3.0)
Eosinophils Absolute: 0.1 10*3/uL (ref 0.0–0.7)
Eosinophils Relative: 1.6 % (ref 0.0–5.0)
HCT: 46.6 % (ref 39.0–52.0)
Hemoglobin: 15.2 g/dL (ref 13.0–17.0)
Lymphocytes Relative: 30.8 % (ref 12.0–46.0)
Lymphs Abs: 2.1 10*3/uL (ref 0.7–4.0)
MCHC: 32.7 g/dL (ref 30.0–36.0)
MCV: 86.4 fl (ref 78.0–100.0)
Monocytes Absolute: 0.5 10*3/uL (ref 0.1–1.0)
Monocytes Relative: 6.9 % (ref 3.0–12.0)
Neutro Abs: 4.2 10*3/uL (ref 1.4–7.7)
Neutrophils Relative %: 59.9 % (ref 43.0–77.0)
Platelets: 252 10*3/uL (ref 150.0–400.0)
RBC: 5.39 Mil/uL (ref 4.22–5.81)
RDW: 15.4 % (ref 11.5–15.5)
WBC: 6.9 10*3/uL (ref 4.0–10.5)

## 2021-06-25 LAB — LIPID PANEL
Cholesterol: 178 mg/dL (ref 0–200)
HDL: 44 mg/dL (ref >39.00)
LDL Cholesterol: 113 mg/dL — ABNORMAL HIGH (ref 0–99)
NonHDL: 134
Total CHOL/HDL Ratio: 4
Triglycerides: 103 mg/dL (ref 0.0–149.0)
VLDL: 20.6 mg/dL (ref 0.0–40.0)

## 2021-06-25 LAB — PSA: PSA: 0.43 ng/mL (ref 0.10–4.00)

## 2021-06-25 LAB — TSH: TSH: 2.1 u[IU]/mL (ref 0.35–5.50)

## 2021-06-25 MED ORDER — METFORMIN HCL 500 MG PO TABS: ORAL_TABLET | ORAL | 3 refills | Status: DC

## 2021-06-25 MED ORDER — METOPROLOL SUCCINATE ER 25 MG PO TB24: 25.0000 mg | ORAL_TABLET | Freq: Every day | ORAL | 3 refills | Status: DC

## 2021-06-25 NOTE — Patient Instructions (Signed)
Health Maintenance, Male Adopting a healthy lifestyle and getting preventive care are important in promoting health and wellness. Ask your health care provider about: The right schedule for you to have regular tests and exams. Things you can do on your own to prevent diseases and keep yourself healthy. What should I know about diet, weight, and exercise? Eat a healthy diet  Eat a diet that includes plenty of vegetables, fruits, low-fat dairy products, and lean protein. Do not eat a lot of foods that are high in solid fats, added sugars, or sodium. Maintain a healthy weight Body mass index (BMI) is a measurement that can be used to identify possible weight problems. It estimates body fat based on height and weight. Your health care provider can help determine your BMI and help you achieve or maintain a healthy weight. Get regular exercise Get regular exercise. This is one of the most important things you can do for your health. Most adults should: Exercise for at least 150 minutes each week. The exercise should increase your heart rate and make you sweat (moderate-intensity exercise). Do strengthening exercises at least twice a week. This is in addition to the moderate-intensity exercise. Spend less time sitting. Even light physical activity can be beneficial. Watch cholesterol and blood lipids Have your blood tested for lipids and cholesterol at 53 years of age, then have this test every 5 years. You may need to have your cholesterol levels checked more often if: Your lipid or cholesterol levels are high. You are older than 53 years of age. You are at high risk for heart disease. What should I know about cancer screening? Many types of cancers can be detected early and may often be prevented. Depending on your health history and family history, you may need to have cancer screening at various ages. This may include screening for: Colorectal cancer. Prostate cancer. Skin cancer. Lung  cancer. What should I know about heart disease, diabetes, and high blood pressure? Blood pressure and heart disease High blood pressure causes heart disease and increases the risk of stroke. This is more likely to develop in people who have high blood pressure readings, are of African descent, or are overweight. Talk with your health care provider about your target blood pressure readings. Have your blood pressure checked: Every 3-5 years if you are 18-39 years of age. Every year if you are 40 years old or older. If you are between the ages of 65 and 75 and are a current or former smoker, ask your health care provider if you should have a one-time screening for abdominal aortic aneurysm (AAA). Diabetes Have regular diabetes screenings. This checks your fasting blood sugar level. Have the screening done: Once every three years after age 45 if you are at a normal weight and have a low risk for diabetes. More often and at a younger age if you are overweight or have a high risk for diabetes. What should I know about preventing infection? Hepatitis B If you have a higher risk for hepatitis B, you should be screened for this virus. Talk with your health care provider to find out if you are at risk for hepatitis B infection. Hepatitis C Blood testing is recommended for: Everyone born from 1945 through 1965. Anyone with known risk factors for hepatitis C. Sexually transmitted infections (STIs) You should be screened each year for STIs, including gonorrhea and chlamydia, if: You are sexually active and are younger than 53 years of age. You are older than 53 years   of age and your health care provider tells you that you are at risk for this type of infection. Your sexual activity has changed since you were last screened, and you are at increased risk for chlamydia or gonorrhea. Ask your health care provider if you are at risk. Ask your health care provider about whether you are at high risk for HIV.  Your health care provider may recommend a prescription medicine to help prevent HIV infection. If you choose to take medicine to prevent HIV, you should first get tested for HIV. You should then be tested every 3 months for as long as you are taking the medicine. Follow these instructions at home: Lifestyle Do not use any products that contain nicotine or tobacco, such as cigarettes, e-cigarettes, and chewing tobacco. If you need help quitting, ask your health care provider. Do not use street drugs. Do not share needles. Ask your health care provider for help if you need support or information about quitting drugs. Alcohol use Do not drink alcohol if your health care provider tells you not to drink. If you drink alcohol: Limit how much you have to 0-2 drinks a day. Be aware of how much alcohol is in your drink. In the [place] one drink equals one 12 oz bottle of beer (355 mL), one 5 oz glass of wine (148 mL), or one 1 oz glass of hard liquor (44 mL). General instructions Schedule regular health, dental, and eye exams. Stay current with your vaccines. Tell your health care provider if: You often feel depressed. You have ever been abused or do not feel safe at home. Summary Adopting a healthy lifestyle and getting preventive care are important in promoting health and wellness. Follow your health care provider's instructions about healthy diet, exercising, and getting tested or screened for diseases. Follow your health care provider's instructions on monitoring your cholesterol and blood pressure. This information is not intended to replace advice given to you by your health care provider. Make sure you discuss any questions you have with your health care provider. Document Revised: 11/08/2020 Document Reviewed: 08/24/2018 Elsevier Patient Education  2022 [employer]  

## 2021-06-25 NOTE — Progress Notes (Signed)
Office Note 06/25/2021  CC:  Chief Complaint  Patient presents with   Annual Exam    Pt is fasting    HPI:  Patient is a 54 y.o. male who is here for annual health maintenance exam and 3 mo f/u prediabetes, HTN, and HLD.  Pt saw endo, dr Loanne Drilling, 05/02/21 and was started on semaglutide and was kept on metformin. Pt does says that about once every few months he'll feel like he has a hypoglycemic rxn, resolves with eating high carb food. Pt's Hba1c max has been 6.3% which was in July this year.  Hba1c at Dr. Cordelia Pen was 5.8%.  Past Medical History:  Diagnosis Date   ALLERGIC RHINITIS 05/12/2007   Qualifier: Diagnosis of  By: Sherren Mocha, RN, Ellen     COVID-19 virus infection 09/20/2020   Elevated transaminase level 2009-2013   suspect NASH (2017)-pt preferred to simply start statin and skip abd u/s.  Viral Hep serologies neg.   Hyperlipidemia    Atorva intolerant (myalgias).  Rosuva 27m started 06/2017-intol.  pt tolerates zetia   Hypertension    Obesity, Class II, BMI 35-39.9, with comorbidity    Osteoarthritis of both hips    Staged bilat THA 2021   Other malaise and fatigue 2011   Testosterone normal 2011   Prediabetes    Recurrent sinusitis 11/16/2011    Past Surgical History:  Procedure Laterality Date   SHOULDER SURGERY  2013; 2016   Left rotator cuff and biceps tendon repair (s/p fall)-2013, left.  Right 2016.   TOTAL HIP ARTHROPLASTY Bilateral 12/2019; 07/2020   Staged; Left, followed by Right 6 mo later.  Dr. SDelfino Lovett   Family History  Problem Relation Age of Onset   Hypertension Father    Hypertension Mother    Alzheimer's disease Mother    Celiac disease Mother    Osteoporosis Mother    Stroke Paternal Grandfather    Heart disease Paternal Grandfather    Hypertension Paternal Grandfather    Diabetes Paternal Grandfather    Kidney disease Paternal Grandfather     Social History   Socioeconomic History   Marital status: Married    Spouse name: JAlmyra Free   Number of children: 2   Years of education: HS   Highest education level: Not on file  Occupational History    Employer: OTHER    Comment: [employer]  Tobacco Use   Smoking status: Never   Smokeless tobacco: Former    Types: CNurse, children'sUse: Never used  Substance and Sexual Activity   Alcohol use: Yes    Comment: socially   Drug use: No   Sexual activity: Not on file  Other Topics Concern   Not on file  Social History Narrative   Married, 2 kids.   Long time local resident, grad of [employer]     Self employed: sells parts to [employer]   No Tob.  Rare/social ETOH.  No drugs.   Social Determinants of Health   Financial Resource Strain: Not on file  Food Insecurity: Not on file  Transportation Needs: Not on file  Physical Activity: Not on file  Stress: Not on file  Social Connections: Not on file  Intimate Partner Violence: Not on file    Outpatient Medications Prior to Visit  Medication Sig Dispense Refill   Ascorbic Acid (VITAMIN C PO) Take by mouth daily.     cetirizine (ZYRTEC) 10 MG tablet TAKE 1 TABLET BY MOUTH EVERY DAY  90 tablet 3   ezetimibe (ZETIA) 10 MG tablet Take 1 tablet (10 mg total) by mouth daily. 90 tablet 3   lisinopril-hydrochlorothiazide (ZESTORETIC) 20-25 MG tablet Take 1 tablet by mouth daily. 90 tablet 3   Multiple Vitamin (MULTIVITAMIN) tablet Take 1 tablet by mouth daily.     Multiple Vitamins-Minerals (ZINC PO) Take by mouth daily.     Semaglutide (RYBELSUS) 3 MG TABS Take 3 mg by mouth daily. 90 tablet 3   metFORMIN (GLUCOPHAGE) 500 MG tablet 1 tab po qd with supper 30 tablet 6   metoprolol succinate (TOPROL-XL) 25 MG 24 hr tablet TAKE 1 TABLET (25 MG TOTAL) BY MOUTH DAILY. 30 tablet 5   No facility-administered medications prior to visit.    No Known Allergies  ROS Review of Systems  PE; Vitals with BMI 06/25/2021 05/02/2021 03/25/2021  Height 5' 11"  5' 11"  5' 11"   Weight 261 lbs 258 lbs 6 oz 263 lbs 6 oz   BMI 36.42 14.48 18.56  Systolic 314 970 263  Diastolic 85 80 85  Pulse 60 72 58   Gen: Alert, well appearing.  Patient is oriented to person, place, time, and situation. AFFECT: pleasant, lucid thought and speech. ENT: Ears: EACs clear, normal epithelium.  TMs with good light reflex and landmarks bilaterally.  Eyes: no injection, icteris, swelling, or exudate.  EOMI, PERRLA. Nose: no drainage or turbinate edema/swelling.  No injection or focal lesion.  Mouth: lips without lesion/swelling.  Oral mucosa pink and moist.  Dentition intact and without obvious caries or gingival swelling.  Oropharynx without erythema, exudate, or swelling.  Neck: supple/nontender.  No LAD, mass, or TM.  Carotid pulses 2+ bilaterally, without bruits. CV: RRR, no m/r/g.   LUNGS: CTA bilat, nonlabored resps, good aeration in all lung fields. ABD: soft, NT, ND, BS normal.  No hepatospenomegaly or mass.  No bruits. EXT: no clubbing, cyanosis, or edema.  Musculoskeletal: no joint swelling, erythema, warmth, or tenderness.  ROM of all joints intact. Skin - no sores or suspicious lesions or rashes or color changes   Pertinent labs:  Lab Results  Component Value Date   TSH 2.34 06/12/2020   Lab Results  Component Value Date   WBC 5.6 06/12/2020   HGB 14.2 06/12/2020   HCT 42.4 06/12/2020   MCV 85.0 06/12/2020   PLT 271.0 06/12/2020   Lab Results  Component Value Date   CREATININE 1.03 03/25/2021   BUN 14 03/25/2021   NA 136 03/25/2021   K 4.1 03/25/2021   CL 98 03/25/2021   CO2 29 03/25/2021   Lab Results  Component Value Date   ALT 43 12/03/2020   AST 30 12/03/2020   ALKPHOS 85 12/03/2020   BILITOT 0.4 12/03/2020   Lab Results  Component Value Date   CHOL 192 12/03/2020   Lab Results  Component Value Date   HDL 40.80 12/03/2020   Lab Results  Component Value Date   LDLCALC 122 (H) 12/03/2020   Lab Results  Component Value Date   TRIG 147.0 12/03/2020   Lab Results  Component Value  Date   CHOLHDL 5 12/03/2020   Lab Results  Component Value Date   PSA 0.34 06/12/2020   PSA 0.41 06/08/2019   Lab Results  Component Value Date   HGBA1C 5.8 (A) 05/02/2021   ASSESSMENT AND PLAN:   1) Prediabetes: now followed by Dr. Loanne Drilling. Of note, Dr. Loanne Drilling dx'd him as DM 2, not prediabetes.   Hba1c in Dr. Cordelia Pen office  2 mo ago was 5.8%. Cont semaglutide but we will go ahead and discontinue his metformin.    2) HTN: good control.  Cont toprol xl 50 qd and lisin-hct 20-25 qd. Electrolytes and creatinine level today.  3) HLD: Statin intolerant.  Doing well on Zetia 10 mg a day.  Checking lipid panel and hepatic panel today.  4) Health maintenance exam: Reviewed age and gender appropriate health maintenance issues (prudent diet, regular exercise, health risks of tobacco and excessive alcohol, use of seatbelts, fire alarms in home, use of sunscreen).  Also reviewed age and gender appropriate health screening as well as vaccine recommendations. Vaccines: flu->gets this via employer.  Otherwise all utd. Labs: fasting HP, PSA. Prostate ca screening: PSA. Colon ca screening: he has not had initial screening->discussed options, he is average risk, he chose [facility] today.   An After Visit Summary was printed and given to the patient.  FOLLOW UP:  Return in about 6 months (around 12/24/2021).  Signed:  Crissie Sickles, MD           06/25/2021

## 2021-07-09 ENCOUNTER — Other Ambulatory Visit: Payer: Self-pay | Admitting: Family Medicine

## 2021-08-29 ENCOUNTER — Other Ambulatory Visit: Payer: Self-pay

## 2021-08-29 ENCOUNTER — Ambulatory Visit (INDEPENDENT_AMBULATORY_CARE_PROVIDER_SITE_OTHER): Payer: 59 | Admitting: Endocrinology

## 2021-08-29 VITALS — BP 128/88 | HR 84 | Ht 71.0 in | Wt 265.0 lb

## 2021-08-29 DIAGNOSIS — E119 Type 2 diabetes mellitus without complications: Secondary | ICD-10-CM | POA: Diagnosis not present

## 2021-08-29 DIAGNOSIS — R635 Abnormal weight gain: Secondary | ICD-10-CM

## 2021-08-29 LAB — POCT GLYCOSYLATED HEMOGLOBIN (HGB A1C): Hemoglobin A1C: 6 % — AB (ref 4.0–5.6)

## 2021-08-29 NOTE — Progress Notes (Signed)
Subjective:    Patient ID: Vernon Mccormick, male    DOB: May 19, 1966, 55 y.o.   MRN: [id number]  HPI Pt returns for f/u of diabetes mellitus: DM type: 2 Dx'ed: 7062 Complications: none Therapy: 2 oral meds DKA: never Severe hypoglycemia: never Pancreatitis: never Pancreatic imaging: none known SDOH: none Other: he has never been on insulin; he stopped metformin, due to hypoglycemia Interval history: Metformin was stopped due to hypoglycemia.  Since off it, sxs are resolved.  He says cbg varies from 62-240.   Past Medical History:  Diagnosis Date   ALLERGIC RHINITIS 05/12/2007   Qualifier: Diagnosis of  By: Sherren Mocha, RN, Ellen     COVID-19 virus infection 09/20/2020   Elevated transaminase level 2009-2013   suspect NASH (2017)-pt preferred to simply start statin and skip abd u/s.  Viral Hep serologies neg.   Hyperlipidemia    Atorva intolerant (myalgias).  Rosuva 34m started 06/2017-intol.  pt tolerates zetia   Hypertension    Obesity, Class II, BMI 35-39.9, with comorbidity    Osteoarthritis of both hips    Staged bilat THA 2021   Other malaise and fatigue 2011   Testosterone normal 2011   Prediabetes    Recurrent sinusitis 11/16/2011    Past Surgical History:  Procedure Laterality Date   SHOULDER SURGERY  2013; 2016   Left rotator cuff and biceps tendon repair (s/p fall)-2013, left.  Right 2016.   TOTAL HIP ARTHROPLASTY Bilateral 12/2019; 07/2020   Staged; Left, followed by Right 6 mo later.  Dr. SDelfino Lovett   Social History   Socioeconomic History   Marital status: Married    Spouse name: JAlmyra Free  Number of children: 2   Years of education: HS   Highest education level: Not on file  Occupational History    Employer: OTHER    Comment: [employer]  Tobacco Use   Smoking status: Never   Smokeless tobacco: Former    Types: CNurse, children'sUse: Never used  Substance and Sexual Activity   Alcohol use: Yes    Comment: socially   Drug use: No   Sexual  activity: Not on file  Other Topics Concern   Not on file  Social History Narrative   Married, 2 kids.   Long time local resident, grad of [employer]     Self employed: sells parts to [employer]   No Tob.  Rare/social ETOH.  No drugs.   Social Determinants of Health   Financial Resource Strain: Not on file  Food Insecurity: Not on file  Transportation Needs: Not on file  Physical Activity: Not on file  Stress: Not on file  Social Connections: Not on file  Intimate Partner Violence: Not on file    Current Outpatient Medications on File Prior to Visit  Medication Sig Dispense Refill   Ascorbic Acid (VITAMIN C PO) Take by mouth daily.     cetirizine (ZYRTEC) 10 MG tablet TAKE 1 TABLET BY MOUTH EVERY DAY 90 tablet 3   ezetimibe (ZETIA) 10 MG tablet Take 1 tablet (10 mg total) by mouth daily. 90 tablet 3   lisinopril-hydrochlorothiazide (ZESTORETIC) 20-25 MG tablet Take 1 tablet by mouth daily. 90 tablet 3   metoprolol succinate (TOPROL-XL) 25 MG 24 hr tablet Take 1 tablet (25 mg total) by mouth daily. 90 tablet 3   Multiple Vitamin (MULTIVITAMIN) tablet Take 1 tablet by mouth daily.     Multiple Vitamins-Minerals (ZINC PO) Take by mouth daily.  Semaglutide (RYBELSUS) 3 MG TABS Take 3 mg by mouth daily. 90 tablet 3   No current facility-administered medications on file prior to visit.    No Known Allergies  Family History  Problem Relation Age of Onset   Hypertension Father    Hypertension Mother    Alzheimer's disease Mother    Celiac disease Mother    Osteoporosis Mother    Stroke Paternal Grandfather    Heart disease Paternal Grandfather    Hypertension Paternal Grandfather    Diabetes Paternal Grandfather    Kidney disease Paternal Grandfather     BP 128/88 (BP Location: Right Arm, Patient Position: Sitting, Cuff Size: Normal)    Pulse 84    Ht 5' 11"  (1.803 m)    Wt 265 lb (120.2 kg)    SpO2 98%    BMI 36.96 kg/m   Review of Systems      Objective:   Physical Exam    Lab Results  Component Value Date   CREATININE 1.02 06/25/2021   BUN 15 06/25/2021   NA 137 06/25/2021   K 4.1 06/25/2021   CL 99 06/25/2021   CO2 29 06/25/2021   Lab Results  Component Value Date   HGBA1C 6.0 (A) 08/29/2021       Assessment & Plan:  Type 2 DM: well-controlled Hypoglycemia, due to metformin  Patient Instructions  Please continue the same Rybelsus.   Please see a weight loss specialist.  you will receive a phone call, about a day and time for an appointment.  check your blood sugar twice a week.  vary the time of day when you check, between before the 3 meals, and at bedtime.  also check if you have symptoms of your blood sugar being too high or too low.  please keep a record of the readings and bring it to your next appointment here (or you can bring the meter itself).  You can write it on any piece of paper.  please call us sooner if your blood sugar goes below 70, or if most of your readings are over 200.   Please come back for a follow-up appointment in 3-4 months.

## 2021-08-29 NOTE — Patient Instructions (Addendum)
Please continue the same Rybelsus.   Please see a weight loss specialist.  you will receive a phone call, about a day and time for an appointment.  check your blood sugar twice a week.  vary the time of day when you check, between before the 3 meals, and at bedtime.  also check if you have symptoms of your blood sugar being too high or too low.  please keep a record of the readings and bring it to your next appointment here (or you can bring the meter itself).  You can write it on any piece of paper.  please call us sooner if your blood sugar goes below 70, or if most of your readings are over 200.   Please come back for a follow-up appointment in 3-4 months.

## 2021-09-04 DIAGNOSIS — Z0289 Encounter for other administrative examinations: Secondary | ICD-10-CM

## 2021-10-07 ENCOUNTER — Encounter (INDEPENDENT_AMBULATORY_CARE_PROVIDER_SITE_OTHER): Payer: Self-pay | Admitting: Family Medicine

## 2021-10-07 ENCOUNTER — Other Ambulatory Visit: Payer: Self-pay

## 2021-10-07 ENCOUNTER — Ambulatory Visit (INDEPENDENT_AMBULATORY_CARE_PROVIDER_SITE_OTHER): Payer: 59 | Admitting: Family Medicine

## 2021-10-07 VITALS — BP 149/89 | HR 60 | Temp 97.6°F | Ht 70.0 in | Wt 263.0 lb

## 2021-10-07 DIAGNOSIS — E1159 Type 2 diabetes mellitus with other circulatory complications: Secondary | ICD-10-CM

## 2021-10-07 DIAGNOSIS — E785 Hyperlipidemia, unspecified: Secondary | ICD-10-CM | POA: Diagnosis not present

## 2021-10-07 DIAGNOSIS — Z6837 Body mass index (BMI) 37.0-37.9, adult: Secondary | ICD-10-CM

## 2021-10-07 DIAGNOSIS — R0602 Shortness of breath: Secondary | ICD-10-CM | POA: Diagnosis not present

## 2021-10-07 DIAGNOSIS — R0683 Snoring: Secondary | ICD-10-CM

## 2021-10-07 DIAGNOSIS — M25552 Pain in left hip: Secondary | ICD-10-CM

## 2021-10-07 DIAGNOSIS — I152 Hypertension secondary to endocrine disorders: Secondary | ICD-10-CM | POA: Diagnosis not present

## 2021-10-07 DIAGNOSIS — E66812 Obesity, class 2: Secondary | ICD-10-CM

## 2021-10-07 DIAGNOSIS — R5383 Other fatigue: Secondary | ICD-10-CM

## 2021-10-07 DIAGNOSIS — Z1331 Encounter for screening for depression: Secondary | ICD-10-CM | POA: Diagnosis not present

## 2021-10-07 DIAGNOSIS — R948 Abnormal results of function studies of other organs and systems: Secondary | ICD-10-CM

## 2021-10-07 DIAGNOSIS — E1169 Type 2 diabetes mellitus with other specified complication: Secondary | ICD-10-CM | POA: Diagnosis not present

## 2021-10-07 DIAGNOSIS — E668 Other obesity: Secondary | ICD-10-CM

## 2021-10-07 DIAGNOSIS — G8929 Other chronic pain: Secondary | ICD-10-CM

## 2021-10-07 DIAGNOSIS — M25551 Pain in right hip: Secondary | ICD-10-CM

## 2021-10-08 ENCOUNTER — Other Ambulatory Visit (INDEPENDENT_AMBULATORY_CARE_PROVIDER_SITE_OTHER): Payer: Self-pay | Admitting: Family Medicine

## 2021-10-08 DIAGNOSIS — E1169 Type 2 diabetes mellitus with other specified complication: Secondary | ICD-10-CM

## 2021-10-08 MED ORDER — TIRZEPATIDE 2.5 MG/0.5ML ~~LOC~~ SOAJ: 2.5000 mg | SUBCUTANEOUS | 0 refills | Status: DC

## 2021-10-08 NOTE — Telephone Encounter (Signed)
Dr.Wallace °

## 2021-10-09 ENCOUNTER — Encounter (INDEPENDENT_AMBULATORY_CARE_PROVIDER_SITE_OTHER): Payer: Self-pay | Admitting: Family Medicine

## 2021-10-09 NOTE — Telephone Encounter (Signed)
Dr.Wallace °

## 2021-10-09 NOTE — Telephone Encounter (Signed)
Dr.wallace

## 2021-10-14 NOTE — Progress Notes (Signed)
Dear Dr. Loanne Drilling,   Thank you for referring Vernon Mccormick to our clinic. The following note includes my evaluation and treatment recommendations.  Chief Complaint:   OBESITY Vernon Mccormick (MR# [id number]) is a 56 y.o. male who presents for evaluation and treatment of obesity and related comorbidities. Current BMI is Body mass index is 37.74 kg/m. Vernon Mccormick has been struggling with his weight for many years and has been unsuccessful in either losing weight, maintaining weight loss, or reaching his healthy weight goal.  Vernon Mccormick is currently in the action stage of change and ready to dedicate time achieving and maintaining a healthier weight. Vernon Mccormick is interested in becoming our patient and working on intensive lifestyle modifications including (but not limited to) diet and exercise for weight loss.  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  He lives with his wife and daughter (22).  He says he gets in 9000 steps per day.  His goal weight is 200 pounds.  He says he overeats and eats at night.  He has had 2 hip replacements.  He says that walking is okay.  Joaopedro's habits were reviewed today and are as follows: His family eats meals together, he thinks his family will eat healthier with him, his desired weight loss is 65 pounds, he has been heavy most of his life, he started gaining weight in late elementary school, his heaviest weight ever was 282 pounds, he craves chips and sweets, he snacks frequently in the evenings, he is frequently drinking liquids with calories, and he frequently eats larger portions than normal.  Depression Screen Meril's Food and Mood (modified PHQ-9) score was 7.  Depression screen [facility] Mccormick 2/9 10/07/2021  Decreased Interest 1  Down, Depressed, Hopeless 0  PHQ - 2 Score 1  Altered sleeping 3  Tired, decreased energy 3  Change in appetite 0  Feeling bad or failure about yourself  0  Trouble concentrating 0  Moving slowly or fidgety/restless 0  Suicidal  thoughts 0  PHQ-9 Score 7  Difficult doing work/chores Not difficult at all   Assessment/Plan:   1. Other fatigue Tamas denies daytime somnolence and reports waking up still tired. Patent has a history of symptoms of morning tiredness and snoring. Meryl generally gets 5 hours of sleep per night, and states that he has poor quality sleep. Snoring is present. Apneic episodes are not present. Epworth Sleepiness Score is 10.  Wisdom does feel that his weight is causing his energy to be lower than it should be. Fatigue may be related to obesity, depression or many other causes. Labs will be ordered, and in the meanwhile, Domonique will focus on self care including making healthy food choices, increasing physical activity and focusing on stress reduction.  - EKG 12-Lead  2. SOB (shortness of breath) on exertion Vernon Mccormick notes increasing shortness of breath with exercising and seems to be worsening over time with weight gain. He notes getting out of breath sooner with activity than he used to. This has gotten worse recently. Emmette denies shortness of breath at rest or orthopnea.  Carsyn does feel that he gets out of breath more easily that he used to when he exercises. Aquarius's shortness of breath appears to be obesity related and exercise induced. He has agreed to work on weight loss and gradually increase exercise to treat his exercise induced shortness of breath. Will continue to monitor closely.  3. Type 2 diabetes mellitus with other specified complication, without long-term current use of insulin (  Vernon Mccormick) Diabetes Mellitus: Not at goal. Medication: Rybelsus 3 mg daily. Issues reviewed: blood sugar goals, complications of diabetes mellitus, hypoglycemia prevention and treatment, exercise, and nutrition.  Plan:  Stop Rybelsus and start Mounjaro 2.5 mg subcutaneously weekly. The patient will continue to focus on protein-rich, low simple carbohydrate foods. We reviewed the importance of hydration, regular  exercise for stress reduction, and restorative sleep.   Lab Results  Component Value Date   HGBA1C 6.0 (A) 08/29/2021   HGBA1C 5.8 (A) 05/02/2021   HGBA1C 6.3 03/25/2021   Lab Results  Component Value Date   LDLCALC 113 (H) 06/25/2021   CREATININE 1.02 06/25/2021   - Start tirzepatide [facility]) 2.5 MG/0.5ML Pen; Inject 2.5 mg into the skin once a week.  Dispense: 2 mL; Refill: 0  4. Hypertension associated with type 2 diabetes mellitus (HCC) Elevated today. Medications: lisinopril-HCTZ 20-25 mg daily and metoprolol 25 mg daily .   Plan: Avoid buying foods that are: processed, frozen, or prepackaged to avoid excess salt. We will watch for signs of hypotension as he continues lifestyle modifications.  BP Readings from Last 3 Encounters:  10/07/21 (!) 149/89  08/29/21 128/88  06/25/21 130/85   Lab Results  Component Value Date   CREATININE 1.02 06/25/2021   5. Hyperlipidemia associated with type 2 diabetes mellitus ([place]) Course: Not optimized. Lipid-lowering medications: Zetia 10 mg daily.   Plan: Dietary changes: Increase soluble fiber, decrease simple carbohydrates, decrease saturated fat. Exercise changes: Moderate to vigorous-intensity aerobic activity 150 minutes per week or as tolerated. We will continue to monitor along with PCP/specialists as it pertains to his weight loss journey.  Lab Results  Component Value Date   CHOL 178 06/25/2021   HDL 44.00 06/25/2021   LDLCALC 113 (H) 06/25/2021   LDLDIRECT 172.1 11/08/2013   TRIG 103.0 06/25/2021   CHOLHDL 4 06/25/2021   Lab Results  Component Value Date   ALT 58 (H) 06/25/2021   AST 38 (H) 06/25/2021   ALKPHOS 70 06/25/2021   BILITOT 0.5 06/25/2021   The 10-year ASCVD risk score (Arnett DK, et al., 2019) is: 12.9%   Values used to calculate the score:     Age: 19 years     Sex: Male     Is Non-Hispanic African American: No     Diabetic: Yes     Tobacco smoker: No     Systolic Blood Pressure: 496 mmHg     Is  BP treated: Yes     HDL Cholesterol: 44 mg/dL     Total Cholesterol: 178 mg/dL  6. Snores Improved with weight loss.  Epworth Sleepiness Score is 10.  7. Abnormal metabolism, slow Mohmmad's metabolism is slower than normal.  8. Chronic hip pain, bilateral Status post hip replacement.  9. Depression screening Riley was screened for depression as part of his new patient workup today.  PHQ-9 is 7.  10. Class 2 severe obesity with serious comorbidity and body mass index (BMI) of 37.0 to 37.9 in adult, unspecified obesity type Vernon Mccormick)  Vernon Mccormick is currently in the action stage of change and his goal is to continue with weight loss efforts. I recommend Vernon Mccormick begin the structured treatment plan as follows:  He has agreed to the Category 2 Plan.  Exercise goals:  As is.    Behavioral modification strategies: increasing lean protein intake, decreasing simple carbohydrates, increasing vegetables, increasing water intake, and decreasing liquid calories.  He was informed of the importance of frequent follow-up visits to maximize his success with  intensive lifestyle modifications for his multiple health conditions. He was informed we would discuss his lab results at his next visit unless there is a critical issue that needs to be addressed sooner. Vernon Mccormick agreed to keep his next visit at the agreed upon time to discuss these results.  Objective:   Blood pressure (!) 149/89, pulse 60, temperature 97.6 F (36.4 C), temperature source Oral, height 5' 10"  (1.778 m), weight 263 lb (119.3 kg), SpO2 98 %. Body mass index is 37.74 kg/m.  EKG: Normal sinus rhythm, rate 70 bpm.  Indirect Calorimeter completed today shows a VO2 of 247 and a REE of 1699.  His calculated basal metabolic rate is 7948 thus his basal metabolic rate is worse than expected.  General: Cooperative, alert, well developed, in no acute distress. HEENT: Conjunctivae and lids unremarkable. Cardiovascular: Regular rhythm.  Lungs: Normal  work of breathing. Neurologic: No focal deficits.   Lab Results  Component Value Date   CREATININE 1.02 06/25/2021   BUN 15 06/25/2021   NA 137 06/25/2021   K 4.1 06/25/2021   CL 99 06/25/2021   CO2 29 06/25/2021   Lab Results  Component Value Date   ALT 58 (H) 06/25/2021   AST 38 (H) 06/25/2021   ALKPHOS 70 06/25/2021   BILITOT 0.5 06/25/2021   Lab Results  Component Value Date   HGBA1C 6.0 (A) 08/29/2021   HGBA1C 5.8 (A) 05/02/2021   HGBA1C 6.3 03/25/2021   HGBA1C 6.3 12/03/2020   HGBA1C 6.1 06/12/2020   Lab Results  Component Value Date   TSH 2.10 06/25/2021   Lab Results  Component Value Date   CHOL 178 06/25/2021   HDL 44.00 06/25/2021   LDLCALC 113 (H) 06/25/2021   LDLDIRECT 172.1 11/08/2013   TRIG 103.0 06/25/2021   CHOLHDL 4 06/25/2021   Lab Results  Component Value Date   WBC 6.9 06/25/2021   HGB 15.2 06/25/2021   HCT 46.6 06/25/2021   MCV 86.4 06/25/2021   PLT 252.0 06/25/2021   Attestation Statements:   This is the patient's first visit at [facility]. The patient's NEW PATIENT PACKET was reviewed at length. Included in the packet: current and past health history, medications, allergies, ROS, gynecologic history (women only), surgical history, family history, social history, weight history, weight loss surgery history (for those that have had weight loss surgery), nutritional evaluation, mood and food questionnaire, PHQ9, Epworth questionnaire, sleep habits questionnaire, patient life and health improvement goals questionnaire. These will all be scanned into the patient's chart under media.   During the visit, I independently reviewed the patient's EKG, bioimpedance scale results, and indirect calorimeter results. I used this information to tailor a meal plan for the patient that will help him to lose weight and will improve his obesity-related conditions going forward. I performed a medically necessary appropriate examination and/or  evaluation. I discussed the assessment and treatment plan with the patient. The patient was provided an opportunity to ask questions and all were answered. The patient agreed with the plan and demonstrated an understanding of the instructions. Labs were ordered at this visit and will be reviewed at the next visit unless more critical results need to be addressed immediately. Clinical information was updated and documented in the EMR.   I, Water quality scientist, CMA, am acting as transcriptionist for Briscoe Deutscher, DO  I have reviewed the above documentation for accuracy and completeness, and I agree with the above. - Briscoe Deutscher, DO, MS, FAAFP, DABOM - Family and Bariatric  Medicine.

## 2021-10-15 NOTE — Telephone Encounter (Signed)
Dr.Wallace °

## 2021-10-20 NOTE — Telephone Encounter (Signed)
Pt last seen by Dr. Wallace.  

## 2021-10-27 ENCOUNTER — Other Ambulatory Visit (INDEPENDENT_AMBULATORY_CARE_PROVIDER_SITE_OTHER): Payer: Self-pay | Admitting: Family Medicine

## 2021-10-27 ENCOUNTER — Other Ambulatory Visit: Payer: Self-pay | Admitting: Family Medicine

## 2021-10-27 DIAGNOSIS — E1169 Type 2 diabetes mellitus with other specified complication: Secondary | ICD-10-CM

## 2021-10-28 ENCOUNTER — Other Ambulatory Visit: Payer: Self-pay

## 2021-10-28 ENCOUNTER — Ambulatory Visit (INDEPENDENT_AMBULATORY_CARE_PROVIDER_SITE_OTHER): Payer: 59 | Admitting: Family Medicine

## 2021-10-28 ENCOUNTER — Encounter (INDEPENDENT_AMBULATORY_CARE_PROVIDER_SITE_OTHER): Payer: Self-pay | Admitting: Family Medicine

## 2021-10-28 VITALS — BP 145/82 | HR 67 | Temp 98.2°F | Ht 70.0 in | Wt 259.0 lb

## 2021-10-28 DIAGNOSIS — E1169 Type 2 diabetes mellitus with other specified complication: Secondary | ICD-10-CM

## 2021-10-28 DIAGNOSIS — I152 Hypertension secondary to endocrine disorders: Secondary | ICD-10-CM | POA: Diagnosis not present

## 2021-10-28 DIAGNOSIS — E1159 Type 2 diabetes mellitus with other circulatory complications: Secondary | ICD-10-CM

## 2021-10-28 DIAGNOSIS — E669 Obesity, unspecified: Secondary | ICD-10-CM

## 2021-10-28 DIAGNOSIS — E785 Hyperlipidemia, unspecified: Secondary | ICD-10-CM | POA: Diagnosis not present

## 2021-10-28 DIAGNOSIS — Z7985 Long-term (current) use of injectable non-insulin antidiabetic drugs: Secondary | ICD-10-CM

## 2021-10-28 DIAGNOSIS — Z6837 Body mass index (BMI) 37.0-37.9, adult: Secondary | ICD-10-CM

## 2021-10-29 NOTE — Progress Notes (Addendum)
Chief Complaint:   OBESITY Vernon Mccormick is here to discuss his progress with his obesity treatment plan along with follow-up of his obesity related diagnoses. See Medical Weight Management Flowsheet for complete bioelectrical impedance results.  Today's visit was #: 2 Starting weight: 263 lbs Starting date: 10/07/2021 Weight change since last visit: 4 lbs Total lbs lost to date: 4 lbs Total weight loss percentage to date: -1.52%  Nutrition Plan: Category 2 Plan for 50% of the time.  Activity: Increased walking.  Interim History: Vernon Mccormick says he enjoyed the cruise to the [place]  He worked to get in protein and increased his water intake.  He has been unable to obtain [facility] yet (PA needed).  He will be going on a cruise to [place] in May.  Assessment/Plan:   1. Type 2 diabetes mellitus with other specified complication, without long-term current use of insulin (HCC) Diabetes Mellitus: Not at goal. Medication: None. Issues reviewed: blood sugar goals, complications of diabetes mellitus, hypoglycemia prevention and treatment, exercise, and nutrition.  Plan: PA has been submitted for Mounjaro 2.5 mg subcutaneously weekly.  He has tried and failed Metformin, Rybelsus, Ozempic, and Victoza. The patient will continue to focus on protein-rich, low simple carbohydrate foods. We reviewed the importance of hydration, regular exercise for stress reduction, and restorative sleep.   Lab Results  Component Value Date   HGBA1C 6.0 (A) 08/29/2021   HGBA1C 5.8 (A) 05/02/2021   HGBA1C 6.3 03/25/2021   Lab Results  Component Value Date   LDLCALC 113 (H) 06/25/2021   CREATININE 1.02 06/25/2021   2. Hypertension associated with type 2 diabetes mellitus (HCC) Elevated. Medications: Zestoretic 20-25 mg daily, metoprolol 25 mg daily.   Plan: Avoid buying foods that are: processed, frozen, or prepackaged to avoid excess salt. We will watch for signs of hypotension as he continues lifestyle  modifications.  BP Readings from Last 3 Encounters:  10/28/21 (!) 145/82  10/07/21 (!) 149/89  08/29/21 128/88   Lab Results  Component Value Date   CREATININE 1.02 06/25/2021   3. Hyperlipidemia associated with type 2 diabetes mellitus ([place]) Course: Not at goal. Lipid-lowering medications: Zetia 10 mg daily.   Plan: Dietary changes: Increase soluble fiber, decrease simple carbohydrates, decrease saturated fat. Exercise changes: Moderate to vigorous-intensity aerobic activity 150 minutes per week or as tolerated. We will continue to monitor along with PCP/specialists as it pertains to his weight loss journey.  Lab Results  Component Value Date   CHOL 178 06/25/2021   HDL 44.00 06/25/2021   LDLCALC 113 (H) 06/25/2021   LDLDIRECT 172.1 11/08/2013   TRIG 103.0 06/25/2021   CHOLHDL 4 06/25/2021   Lab Results  Component Value Date   ALT 58 (H) 06/25/2021   AST 38 (H) 06/25/2021   ALKPHOS 70 06/25/2021   BILITOT 0.5 06/25/2021   The 10-year ASCVD risk score (Arnett DK, et al., 2019) is: 12.3%   Values used to calculate the score:     Age: 56 years     Sex: Male     Is Non-Hispanic African American: No     Diabetic: Yes     Tobacco smoker: No     Systolic Blood Pressure: 428 mmHg     Is BP treated: Yes     HDL Cholesterol: 44 mg/dL     Total Cholesterol: 178 mg/dL  4. Obesity, current BMI 37.2  Course: Vernon Mccormick is currently in the action stage of change. As such, his goal is to continue with  weight loss efforts.   Nutrition goals: He has agreed to the Category 2 Plan.   Exercise goals:  Moved treadmill into the house.  Behavioral modification strategies: increasing lean protein intake, decreasing simple carbohydrates, increasing vegetables, and increasing water intake.  Vernon Mccormick has agreed to follow-up with our clinic in 4 weeks. He was informed of the importance of frequent follow-up visits to maximize his success with intensive lifestyle modifications for his multiple  health conditions.   Objective:   Blood pressure (!) 145/82, pulse 67, temperature 98.2 F (36.8 C), temperature source Oral, height 5' 10"  (1.778 m), weight 259 lb (117.5 kg), SpO2 97 %. Body mass index is 37.16 kg/m.  General: Cooperative, alert, well developed, in no acute distress. HEENT: Conjunctivae and lids unremarkable. Cardiovascular: Regular rhythm.  Lungs: Normal work of breathing. Neurologic: No focal deficits.   Lab Results  Component Value Date   CREATININE 1.02 06/25/2021   BUN 15 06/25/2021   NA 137 06/25/2021   K 4.1 06/25/2021   CL 99 06/25/2021   CO2 29 06/25/2021   Lab Results  Component Value Date   ALT 58 (H) 06/25/2021   AST 38 (H) 06/25/2021   ALKPHOS 70 06/25/2021   BILITOT 0.5 06/25/2021   Lab Results  Component Value Date   HGBA1C 6.0 (A) 08/29/2021   HGBA1C 5.8 (A) 05/02/2021   HGBA1C 6.3 03/25/2021   HGBA1C 6.3 12/03/2020   HGBA1C 6.1 06/12/2020   Lab Results  Component Value Date   TSH 2.10 06/25/2021   Lab Results  Component Value Date   CHOL 178 06/25/2021   HDL 44.00 06/25/2021   LDLCALC 113 (H) 06/25/2021   LDLDIRECT 172.1 11/08/2013   TRIG 103.0 06/25/2021   CHOLHDL 4 06/25/2021   Lab Results  Component Value Date   WBC 6.9 06/25/2021   HGB 15.2 06/25/2021   HCT 46.6 06/25/2021   MCV 86.4 06/25/2021   PLT 252.0 06/25/2021   Attestation Statements:   Reviewed by clinician on day of visit: allergies, medications, problem list, medical history, surgical history, family history, social history, and previous encounter notes.  I, Water quality scientist, CMA, am acting as transcriptionist for Briscoe Deutscher, DO  I have reviewed the above documentation for accuracy and completeness, and I agree with the above. -  Briscoe Deutscher, DO, MS, FAAFP, DABOM - Family and Bariatric Medicine.

## 2021-11-10 NOTE — Telephone Encounter (Signed)
Dr.Wallace °

## 2021-11-27 ENCOUNTER — Other Ambulatory Visit (INDEPENDENT_AMBULATORY_CARE_PROVIDER_SITE_OTHER): Payer: Self-pay | Admitting: Family Medicine

## 2021-11-27 ENCOUNTER — Other Ambulatory Visit: Payer: Self-pay

## 2021-11-27 ENCOUNTER — Encounter (INDEPENDENT_AMBULATORY_CARE_PROVIDER_SITE_OTHER): Payer: Self-pay | Admitting: Family Medicine

## 2021-11-27 ENCOUNTER — Ambulatory Visit (INDEPENDENT_AMBULATORY_CARE_PROVIDER_SITE_OTHER): Payer: 59 | Admitting: Family Medicine

## 2021-11-27 VITALS — BP 126/83 | HR 67 | Temp 98.1°F | Ht 70.0 in | Wt 257.0 lb

## 2021-11-27 DIAGNOSIS — E785 Hyperlipidemia, unspecified: Secondary | ICD-10-CM | POA: Diagnosis not present

## 2021-11-27 DIAGNOSIS — Z6837 Body mass index (BMI) 37.0-37.9, adult: Secondary | ICD-10-CM

## 2021-11-27 DIAGNOSIS — E1159 Type 2 diabetes mellitus with other circulatory complications: Secondary | ICD-10-CM

## 2021-11-27 DIAGNOSIS — E1169 Type 2 diabetes mellitus with other specified complication: Secondary | ICD-10-CM | POA: Diagnosis not present

## 2021-11-27 DIAGNOSIS — I152 Hypertension secondary to endocrine disorders: Secondary | ICD-10-CM | POA: Diagnosis not present

## 2021-11-27 DIAGNOSIS — E669 Obesity, unspecified: Secondary | ICD-10-CM

## 2021-11-27 MED ORDER — MOUNJARO 7.5 MG/0.5ML ~~LOC~~ SOAJ: 7.5000 mg | SUBCUTANEOUS | 0 refills | Status: DC

## 2021-11-27 MED ORDER — MOUNJARO 5 MG/0.5ML ~~LOC~~ SOAJ: 5.0000 mg | SUBCUTANEOUS | 0 refills | Status: DC

## 2021-12-08 ENCOUNTER — Other Ambulatory Visit (INDEPENDENT_AMBULATORY_CARE_PROVIDER_SITE_OTHER): Payer: Self-pay | Admitting: Family Medicine

## 2021-12-08 DIAGNOSIS — E1169 Type 2 diabetes mellitus with other specified complication: Secondary | ICD-10-CM

## 2021-12-09 NOTE — Progress Notes (Signed)
Chief Complaint:   OBESITY Vernon Mccormick is here to discuss his progress with his obesity treatment plan along with follow-up of his obesity related diagnoses. See Medical Weight Management Flowsheet for complete bioelectrical impedance results.  Today's visit was #: 3 Starting weight: 263 lbs Starting date: 10/07/2021 Weight change since last visit: 2 lbs Total lbs lost to date: 6 lbs Total weight loss percentage to date: -2.28%  Nutrition Plan: Category 2 Plan for 80% of the time.  Activity: Increased walking. Anti-obesity medications: Mounjaro 2.5 mg subcutaneously weekly. Reported side effects: None.  Interim History: Vernon Mccormick has been under increased stress with work and family.  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  He says he started [facility] yesterday.  Assessment/Plan:   1. Type 2 diabetes mellitus with other specified complication, without long-term current use of insulin (HCC) Diabetes Mellitus: Not at goal. Medication: Mounjaro 2.5 mg subcutaneously weekly. Issues reviewed: blood sugar goals, complications of diabetes mellitus, hypoglycemia prevention and treatment, exercise, and nutrition.  Plan: Increase Mounjaro to 5 mg subcutaneously weekly and then Mounjaro 7.5 mg subcutaneously weekly. The patient will continue to focus on protein-rich, low simple carbohydrate foods. We reviewed the importance of hydration, regular exercise for stress reduction, and restorative sleep.   Lab Results  Component Value Date   HGBA1C 6.0 (A) 08/29/2021   HGBA1C 5.8 (A) 05/02/2021   HGBA1C 6.3 03/25/2021   Lab Results  Component Value Date   LDLCALC 113 (H) 06/25/2021   CREATININE 1.02 06/25/2021   - Refill tirzepatide (MOUNJARO) 7.5 MG/0.5ML Pen; Inject 7.5 mg into the skin once a week.  Dispense: 2 mL; Refill: 0  2. Hypertension associated with type 2 diabetes mellitus ([place]) At goal. Medications: Zestoretic  20-25 mg daily, metoprolol 25 mg daily.   Plan: Avoid buying foods that are: processed, frozen, or prepackaged to avoid excess salt. We will watch for signs of hypotension as he continues lifestyle modifications.  BP Readings from Last 3 Encounters:  11/27/21 126/83  10/28/21 (!) 145/82  10/07/21 (!) 149/89   Lab Results  Component Value Date   CREATININE 1.02 06/25/2021   3. Hyperlipidemia associated with type 2 diabetes mellitus ([place]) Course: Not at goal. Lipid-lowering medications: Zetia 10 mg daily.   Plan: Dietary changes: Increase soluble fiber, decrease simple carbohydrates, decrease saturated fat. Exercise changes: Moderate to vigorous-intensity aerobic activity 150 minutes per week or as tolerated. We will continue to monitor along with PCP/specialists as it pertains to his weight loss journey.  Lab Results  Component Value Date   CHOL 178 06/25/2021   HDL 44.00 06/25/2021   LDLCALC 113 (H) 06/25/2021   LDLDIRECT 172.1 11/08/2013   TRIG 103.0 06/25/2021   CHOLHDL 4 06/25/2021   Lab Results  Component Value Date   ALT 58 (H) 06/25/2021   AST 38 (H) 06/25/2021   ALKPHOS 70 06/25/2021   BILITOT 0.5 06/25/2021   The 10-year ASCVD risk score (Arnett DK, et al., 2019) is: 9.7%   Values used to calculate the score:     Age: 56 years     Sex: Male     Is Non-Hispanic African American: No     Diabetic: Yes     Tobacco smoker: No     Systolic Blood Pressure: 595 mmHg     Is BP treated: Yes     HDL Cholesterol: 44 mg/dL     Total Cholesterol: 178 mg/dL  4. At risk for heart disease Due to Holton's current state of health and medical condition(s), he is at a higher risk for heart disease.  This puts the patient at much greater risk to subsequently develop cardiopulmonary conditions that can significantly affect patient's quality of life in a negative manner.    At least 9 minutes were spent on counseling Vernon Mccormick about these concerns today, and I stressed the importance of  reversing risks factors of obesity, especially truncal and visceral fat, hypertension, hyperlipidemia, and pre-diabetes.  The initial goal is to lose at least 5-10% of starting weight to help reduce these risk factors.  Counseling:  Intensive lifestyle modifications were discussed with Vernon Mccormick as the most appropriate first line of treatment.  he will continue to work on diet, exercise, and weight loss efforts.  We will continue to reassess these conditions on a fairly regular basis in an attempt to decrease the patient's overall morbidity and mortality.  Evidence-based interventions for health behavior change were utilized today including the discussion of self monitoring techniques, problem-solving barriers, and SMART goal setting techniques.  Specifically, regarding patient's less desirable eating habits and patterns, we employed the technique of small changes when Vernon Mccormick has not been able to fully commit to his prudent nutritional plan.  5. Obesity with current BMI of 37.0  Course: Vernon Mccormick is currently in the action stage of change. As such, his goal is to continue with weight loss efforts.   Nutrition goals: He has agreed to the Category 2 Plan.   Exercise goals:  As is.  Behavioral modification strategies: increasing lean protein intake, decreasing simple carbohydrates, and increasing vegetables.  Vernon Mccormick has agreed to follow-up with our clinic in 3 weeks. He was informed of the importance of frequent follow-up visits to maximize his success with intensive lifestyle modifications for his multiple health conditions.   Objective:   Blood pressure 126/83, pulse 67, temperature 98.1 F (36.7 C), temperature source Oral, height 5' 10"  (1.778 m), weight 257 lb (116.6 kg), SpO2 96 %. Body mass index is 36.88 kg/m.  General: Cooperative, alert, well developed, in no acute distress. HEENT: Conjunctivae and lids unremarkable. Cardiovascular: Regular rhythm.  Lungs: Normal work of  breathing. Neurologic: No focal deficits.   Lab Results  Component Value Date   CREATININE 1.02 06/25/2021   BUN 15 06/25/2021   NA 137 06/25/2021   K 4.1 06/25/2021   CL 99 06/25/2021   CO2 29 06/25/2021   Lab Results  Component Value Date   ALT 58 (H) 06/25/2021   AST 38 (H) 06/25/2021   ALKPHOS 70 06/25/2021   BILITOT 0.5 06/25/2021   Lab Results  Component Value Date   HGBA1C 6.0 (A) 08/29/2021   HGBA1C 5.8 (A) 05/02/2021   HGBA1C 6.3 03/25/2021   HGBA1C 6.3 12/03/2020   HGBA1C 6.1 06/12/2020   Lab Results  Component Value Date   TSH 2.10 06/25/2021   Lab Results  Component Value Date   CHOL 178 06/25/2021   HDL 44.00 06/25/2021   LDLCALC 113 (H) 06/25/2021   LDLDIRECT 172.1 11/08/2013   TRIG 103.0 06/25/2021   CHOLHDL 4 06/25/2021   Lab Results  Component Value Date   WBC 6.9 06/25/2021   HGB 15.2 06/25/2021   HCT 46.6 06/25/2021   MCV 86.4 06/25/2021   PLT 252.0 06/25/2021   Attestation Statements:   Reviewed by clinician on day of visit: allergies, medications, problem list, medical history, surgical history, family history, social history, and previous encounter notes.  I,  Water quality scientist, [place], am acting as transcriptionist for Briscoe Deutscher, DO  I have reviewed the above documentation for accuracy and completeness, and I agree with the above. -  Briscoe Deutscher, DO, MS, FAAFP, DABOM - Family and Bariatric Medicine.

## 2021-12-16 ENCOUNTER — Encounter (INDEPENDENT_AMBULATORY_CARE_PROVIDER_SITE_OTHER): Payer: Self-pay | Admitting: Family Medicine

## 2021-12-16 ENCOUNTER — Other Ambulatory Visit: Payer: Self-pay | Admitting: Family Medicine

## 2021-12-16 ENCOUNTER — Ambulatory Visit (INDEPENDENT_AMBULATORY_CARE_PROVIDER_SITE_OTHER): Payer: 59 | Admitting: Family Medicine

## 2021-12-16 VITALS — BP 133/79 | HR 65 | Temp 97.8°F | Ht 70.0 in | Wt 251.0 lb

## 2021-12-16 DIAGNOSIS — G8929 Other chronic pain: Secondary | ICD-10-CM

## 2021-12-16 DIAGNOSIS — Z6836 Body mass index (BMI) 36.0-36.9, adult: Secondary | ICD-10-CM

## 2021-12-16 DIAGNOSIS — M25551 Pain in right hip: Secondary | ICD-10-CM | POA: Diagnosis not present

## 2021-12-16 DIAGNOSIS — Z7985 Long-term (current) use of injectable non-insulin antidiabetic drugs: Secondary | ICD-10-CM

## 2021-12-16 DIAGNOSIS — E669 Obesity, unspecified: Secondary | ICD-10-CM

## 2021-12-16 DIAGNOSIS — R948 Abnormal results of function studies of other organs and systems: Secondary | ICD-10-CM | POA: Diagnosis not present

## 2021-12-16 DIAGNOSIS — E1169 Type 2 diabetes mellitus with other specified complication: Secondary | ICD-10-CM | POA: Diagnosis not present

## 2021-12-16 DIAGNOSIS — M25552 Pain in left hip: Secondary | ICD-10-CM

## 2021-12-22 NOTE — Progress Notes (Signed)
Chief Complaint:   OBESITY Vernon Mccormick is here to discuss his progress with his obesity treatment plan along with follow-up of his obesity related diagnoses. See Medical Weight Management Flowsheet for complete bioelectrical impedance results.  Today's visit was #: 4 Starting weight: 263 lbs Starting date: 10/07/2021 Weight change since last visit: 6 lbs Total lbs lost to date: 12 lbs Total weight loss percentage to date: -4.56%  Nutrition Plan: Category 2 Plan for 75% of the time.  Activity: Walking 1 mile 4 times per week. Anti-obesity medications: Mounjaro 5 mg subcutaneously weekly. Reported side effects: None.  Interim History: Karanvir increased his Mounjaro to 5 mg last week (took 2 of his 2.5 mg doses).  He endorses less hunger.  Focusing on protein.  He says he was starting to exercise more.  Assessment/Plan:   1. Type 2 diabetes mellitus with other specified complication, without long-term current use of insulin (HCC) Diabetes Mellitus: Not at goal. Medication: Mounjaro 5 mg subcutaneously weekly. Issues reviewed: blood sugar goals, complications of diabetes mellitus, hypoglycemia prevention and treatment, exercise, and nutrition.  Plan: The patient will continue to focus on protein-rich, low simple carbohydrate foods. We reviewed the importance of hydration, regular exercise for stress reduction, and restorative sleep.   Lab Results  Component Value Date   HGBA1C 6.0 (A) 08/29/2021   HGBA1C 5.8 (A) 05/02/2021   HGBA1C 6.3 03/25/2021   Lab Results  Component Value Date   LDLCALC 113 (H) 06/25/2021   CREATININE 1.02 06/25/2021   2. Abnormal metabolism, slow Amahri's metabolism is slower than normal. The current medical regimen is effective;  continue present plan and medications.  3. Chronic hip pain, bilateral He is status post hip replacement. This issue directly impacts care plan for optimization of BMI and metabolic health as it impacts the patient's ability to make  lifestyle changes. We will continue to monitor symptoms as they relate to his weight loss journey.  4. Obesity, current BMI of 36  Course: Vernon Mccormick is currently in the action stage of change. As such, his goal is to continue with weight loss efforts.   Nutrition goals: He has agreed to the Category 2 Plan.   Exercise goals:  As is.  Behavioral modification strategies: increasing lean protein intake, decreasing simple carbohydrates, and increasing vegetables.  Vernon Mccormick has agreed to follow-up with our clinic in 6 weeks. He was informed of the importance of frequent follow-up visits to maximize his success with intensive lifestyle modifications for his multiple health conditions.   Objective:   Blood pressure 133/79, pulse 65, temperature 97.8 F (36.6 C), temperature source Oral, height 5' 10"  (1.778 m), weight 251 lb (113.9 kg), SpO2 97 %. Body mass index is 36.01 kg/m.  General: Cooperative, alert, well developed, in no acute distress. HEENT: Conjunctivae and lids unremarkable. Cardiovascular: Regular rhythm.  Lungs: Normal work of breathing. Neurologic: No focal deficits.   Lab Results  Component Value Date   CREATININE 1.02 06/25/2021   BUN 15 06/25/2021   NA 137 06/25/2021   K 4.1 06/25/2021   CL 99 06/25/2021   CO2 29 06/25/2021   Lab Results  Component Value Date   ALT 58 (H) 06/25/2021   AST 38 (H) 06/25/2021   ALKPHOS 70 06/25/2021   BILITOT 0.5 06/25/2021   Lab Results  Component Value Date   HGBA1C 6.0 (A) 08/29/2021   HGBA1C 5.8 (A) 05/02/2021   HGBA1C 6.3 03/25/2021   HGBA1C 6.3 12/03/2020   HGBA1C 6.1 06/12/2020  Lab Results  Component Value Date   TSH 2.10 06/25/2021   Lab Results  Component Value Date   CHOL 178 06/25/2021   HDL 44.00 06/25/2021   LDLCALC 113 (H) 06/25/2021   LDLDIRECT 172.1 11/08/2013   TRIG 103.0 06/25/2021   CHOLHDL 4 06/25/2021   Lab Results  Component Value Date   WBC 6.9 06/25/2021   HGB 15.2 06/25/2021   HCT 46.6  06/25/2021   MCV 86.4 06/25/2021   PLT 252.0 06/25/2021   Attestation Statements:   Reviewed by clinician on day of visit: allergies, medications, problem list, medical history, surgical history, family history, social history, and previous encounter notes.  I, Water quality scientist, CMA, am acting as transcriptionist for Briscoe Deutscher, DO  I have reviewed the above documentation for accuracy and completeness, and I agree with the above. -  Briscoe Deutscher, DO, MS, FAAFP, DABOM - Family and Bariatric Medicine.

## 2021-12-25 ENCOUNTER — Encounter: Payer: Self-pay | Admitting: Family Medicine

## 2021-12-25 ENCOUNTER — Ambulatory Visit (INDEPENDENT_AMBULATORY_CARE_PROVIDER_SITE_OTHER): Payer: 59 | Admitting: Family Medicine

## 2021-12-25 VITALS — BP 121/77 | HR 68 | Temp 98.1°F | Ht 70.0 in | Wt 255.2 lb

## 2021-12-25 DIAGNOSIS — I10 Essential (primary) hypertension: Secondary | ICD-10-CM

## 2021-12-25 DIAGNOSIS — E119 Type 2 diabetes mellitus without complications: Secondary | ICD-10-CM | POA: Diagnosis not present

## 2021-12-25 DIAGNOSIS — E7800 Pure hypercholesterolemia, unspecified: Secondary | ICD-10-CM | POA: Diagnosis not present

## 2021-12-25 LAB — COMPREHENSIVE METABOLIC PANEL
ALT: 56 U/L — ABNORMAL HIGH (ref 0–53)
AST: 33 U/L (ref 0–37)
Albumin: 4.5 g/dL (ref 3.5–5.2)
Alkaline Phosphatase: 58 U/L (ref 39–117)
BUN: 18 mg/dL (ref 6–23)
CO2: 28 mEq/L (ref 19–32)
Calcium: 9.2 mg/dL (ref 8.4–10.5)
Chloride: 99 mEq/L (ref 96–112)
Creatinine, Ser: 1.08 mg/dL (ref 0.40–1.50)
GFR: 78.43 mL/min (ref >60.00)
Glucose, Bld: 86 mg/dL (ref 70–99)
Potassium: 3.5 mEq/L (ref 3.5–5.1)
Sodium: 133 mEq/L — ABNORMAL LOW (ref 135–145)
Total Bilirubin: 0.7 mg/dL (ref 0.2–1.2)
Total Protein: 6.5 g/dL (ref 6.0–8.3)

## 2021-12-25 LAB — LIPID PANEL
Cholesterol: 189 mg/dL (ref 0–200)
HDL: 40.5 mg/dL (ref >39.00)
LDL Cholesterol: 115 mg/dL — ABNORMAL HIGH (ref 0–99)
NonHDL: 148.15
Total CHOL/HDL Ratio: 5
Triglycerides: 168 mg/dL — ABNORMAL HIGH (ref 0.0–149.0)
VLDL: 33.6 mg/dL (ref 0.0–40.0)

## 2021-12-25 LAB — HEMOGLOBIN A1C: Hgb A1c MFr Bld: 5.9 % (ref 4.6–6.5)

## 2021-12-25 MED ORDER — LISINOPRIL-HYDROCHLOROTHIAZIDE 20-25 MG PO TABS: 1.0000 | ORAL_TABLET | Freq: Every day | ORAL | 1 refills | Status: DC

## 2021-12-25 NOTE — Progress Notes (Signed)
OFFICE VISIT ? ?12/25/2021 ? ?CC:  ?Chief Complaint  ?Patient presents with  ? Hypertension  ? Hyperlipidemia  ?  Pt is fasting  ? ? ?HPI:   ? ?Patient is a 56 y.o. male who presents for 53-monthfollow-up hypertension, prediabetes, and hyperlipidemia. ?He has morbid obesity and is being followed by the healthy weight and wellness clinic. ?A/P as of last visit: ?"1) Prediabetes: now followed by Dr. ELoanne Drilling ?Of note, Dr. ELoanne Drillingdx'd him as DM 2, not prediabetes.   ?Hba1c in Dr. ECordelia Penoffice 2 mo ago was 5.8%. ?Cont semaglutide but we will go ahead and discontinue his metformin.   ?  ?2) HTN: good control.  Cont toprol xl 50 qd and lisin-hct 20-25 qd. ?Electrolytes and creatinine level today. ?  ?3) HLD: Statin intolerant.  Doing well on Zetia 10 mg a day.  Checking lipid panel and hepatic panel today. ?  ?4) Health maintenance exam: ?Reviewed age and gender appropriate health maintenance issues (prudent diet, regular exercise, health risks of tobacco and excessive alcohol, use of seatbelts, fire alarms in home, use of sunscreen).  Also reviewed age and gender appropriate health screening as well as vaccine recommendations. ?Vaccines: flu->gets this via employer.  Otherwise all utd. ?Labs: fasting HP, PSA. ?Prostate ca screening: PSA. ?Colon ca screening: he has not had initial screening->discussed options, he is average risk, he chose iFOB today.  ? ?INTERIM HX: ?All labs excellent last visit. ? ?PSaralyn Pilaris feeling very well. ?He is now being followed by healthy weight and wellness clinic for weight management. ?Prediab/DM/wt mgmt:  he failed rybelsus, ozempic, victoza, and metformin. ?Switched to [employer] wt mg[employer] clinic about 2 mo ago. ?He is very pleased with Mounjaro. ? ?He only occasionally checks a blood pressure at home and it has been somewhere around 130/80 or better. ? ? ? ? ?Past Medical History:  ?Diagnosis Date  ? ALLERGIC RHINITIS 05/12/2007  ? Qualifier: Diagnosis of  By: TScherrie Gerlach   ?  Back pain   ? COVID-19 virus infection 09/20/2020  ? Elevated transaminase level 2009-2013  ? suspect NASH (2017)-pt preferred to simply start statin and skip abd u/s.  Viral Hep serologies neg.  ? Hyperlipidemia   ? Atorva intolerant (myalgias).  Rosuva 172mstarted 06/2017-intol.  pt tolerates zetia  ? Hypertension   ? Joint pain   ? Lower extremity edema   ? Obesity, Class II, BMI 35-39.9, with comorbidity   ? Osteoarthritis of both hips   ? Staged bilat THA 2021  ? Other malaise and fatigue 2011  ? Testosterone normal 2011  ? Prediabetes   ? Recurrent sinusitis 11/16/2011  ? ? ?Past Surgical History:  ?Procedure Laterality Date  ? SHOULDER SURGERY  2013; 2016  ? Left rotator cuff and biceps tendon repair (s/p fall)-2013, left.  Right 2016.  ? TOTAL HIP ARTHROPLASTY Bilateral 12/2019; 07/2020  ? Staged; Left, followed by Right 6 mo later.  Dr. SwDelfino Lovett? ? ?Outpatient Medications Prior to Visit  ?Medication Sig Dispense Refill  ? Ascorbic Acid (VITAMIN C PO) Take by mouth daily.    ? cetirizine (ZYRTEC) 10 MG tablet TAKE 1 TABLET BY MOUTH EVERY DAY 90 tablet 3  ? ezetimibe (ZETIA) 10 MG tablet Take 1 tablet (10 mg total) by mouth daily. 90 tablet 3  ? lisinopril-hydrochlorothiazide (ZESTORETIC) 20-25 MG tablet TAKE 1 TABLET BY MOUTH EVERY DAY 30 tablet 0  ? metoprolol succinate (TOPROL-XL) 25 MG 24 hr tablet Take 1 tablet (25  mg total) by mouth daily. 90 tablet 3  ? Multiple Vitamin (MULTIVITAMIN) tablet Take 1 tablet by mouth daily.    ? Multiple Vitamins-Minerals (ZINC PO) Take by mouth daily.    ? tirzepatide (MOUNJARO) 5 MG/0.5ML Pen Inject 5 mg into the skin once a week. 2 mL 0  ? tirzepatide (MOUNJARO) 7.5 MG/0.5ML Pen Inject 7.5 mg into the skin once a week. 2 mL 0  ? ?No facility-administered medications prior to visit.  ? ? ?No Known Allergies ? ?ROS ?As per HPI ? ?PE: ? ?  12/25/2021  ?  8:04 AM 12/16/2021  ? 12:00 PM 11/27/2021  ? 12:00 PM  ?Vitals with BMI  ?Height 5' 10"  5' 10"  5' 10"   ?Weight 255 lbs 3  oz 251 lbs 257 lbs  ?BMI 36.62 36.01 36.88  ?Systolic 177 939 030  ?Diastolic 77 79 83  ?Pulse 68 65 67  ? ? ? ?Physical Exam ? ?Gen: Alert, well appearing.  Patient is oriented to person, place, time, and situation. ?AFFECT: pleasant, lucid thought and speech. ?CV: RRR, no m/r/g.   ?LUNGS: CTA bilat, nonlabored resps, good aeration in all lung fields. ?EXT: no clubbing or cyanosis.  no edema.  ? ? ?LABS:  ?Last CBC ?Lab Results  ?Component Value Date  ? WBC 6.9 06/25/2021  ? HGB 15.2 06/25/2021  ? HCT 46.6 06/25/2021  ? MCV 86.4 06/25/2021  ? RDW 15.4 06/25/2021  ? PLT 252.0 06/25/2021  ? ?Last metabolic panel ?Lab Results  ?Component Value Date  ? GLUCOSE 92 06/25/2021  ? NA 137 06/25/2021  ? K 4.1 06/25/2021  ? CL 99 06/25/2021  ? CO2 29 06/25/2021  ? BUN 15 06/25/2021  ? CREATININE 1.02 06/25/2021  ? CALCIUM 9.6 06/25/2021  ? PROT 6.9 06/25/2021  ? ALBUMIN 4.5 06/25/2021  ? BILITOT 0.5 06/25/2021  ? ALKPHOS 70 06/25/2021  ? AST 38 (H) 06/25/2021  ? ALT 58 (H) 06/25/2021  ? ?Last lipids ?Lab Results  ?Component Value Date  ? CHOL 178 06/25/2021  ? HDL 44.00 06/25/2021  ? LDLCALC 113 (H) 06/25/2021  ? LDLDIRECT 172.1 11/08/2013  ? TRIG 103.0 06/25/2021  ? CHOLHDL 4 06/25/2021  ? ?Last hemoglobin A1c ?Lab Results  ?Component Value Date  ? HGBA1C 6.0 (A) 08/29/2021  ? ?Last thyroid functions ?Lab Results  ?Component Value Date  ? TSH 2.10 06/25/2021  ? ?IMPRESSION AND PLAN: ? ?#1 hypertension.  Well-controlled on toprol xl 50 qd and lisin-hct 20-25 qd. ?Electrolytes and creatinine level today. ?He would like to get off Toprol if blood pressures remain normal over the next several months.  He will monitor a few days a week and let me know. ? ?#2 hyperlipidemia, statin intolerant.  Doing well on Zetia 10 mg a day. ?Lipid panel and hepatic panel today. ? ?3.  Diabetes type 2, weight management.  Doing well on Mounjaro the last couple months. ?Hemoglobin A1c today. ?Forward results to his weight management MD, Dr. Briscoe Deutscher. ? ?An After Visit Summary was printed and given to the patient. ? ?FOLLOW UP: Return in about 6 months (around 06/26/2022) for annual CPE (fasting). ? ?Signed:  Crissie Sickles, MD           12/25/2021 ? ?

## 2022-01-02 ENCOUNTER — Ambulatory Visit: Payer: 59 | Admitting: Endocrinology

## 2022-02-12 ENCOUNTER — Encounter: Payer: Self-pay | Admitting: Family Medicine

## 2022-02-12 ENCOUNTER — Telehealth: Payer: Self-pay

## 2022-02-12 ENCOUNTER — Ambulatory Visit (INDEPENDENT_AMBULATORY_CARE_PROVIDER_SITE_OTHER): Payer: 59 | Admitting: Family Medicine

## 2022-02-12 VITALS — BP 123/85 | HR 85 | Temp 97.7°F | Ht 70.0 in | Wt 247.6 lb

## 2022-02-12 DIAGNOSIS — U071 COVID-19: Secondary | ICD-10-CM | POA: Diagnosis not present

## 2022-02-12 DIAGNOSIS — R0981 Nasal congestion: Secondary | ICD-10-CM | POA: Diagnosis not present

## 2022-02-12 DIAGNOSIS — J988 Other specified respiratory disorders: Secondary | ICD-10-CM

## 2022-02-12 DIAGNOSIS — R0989 Other specified symptoms and signs involving the circulatory and respiratory systems: Secondary | ICD-10-CM

## 2022-02-12 LAB — POC COVID19 BINAXNOW: SARS Coronavirus 2 Ag: POSITIVE — AB

## 2022-02-12 MED ORDER — NIRMATRELVIR/RITONAVIR (PAXLOVID)TABLET: 3.0000 | ORAL_TABLET | Freq: Two times a day (BID) | ORAL | 0 refills | Status: AC

## 2022-02-12 NOTE — Progress Notes (Signed)
OFFICE VISIT  02/12/2022  CC:  Chief Complaint  Patient presents with   Congestion    Nasal and chest congestion; runny nose. Symptoms started on Monday with scratchy throat. Confirmed with at home test last night. His job does not accept this form of testing.    Patient is a 56 y.o. male who presents for nasal congestion.  HPI: Onset of nasal congestion and runny nose and postnasal drip 3 days ago.  The following day he had some achiness across his low back.  No fever, no wheezing, no shortness of breath, no chest pain. Noting more of a cough in the last 24 to 36 hours.  Mild fatigue.  No nausea, vomiting, or diarrhea.   Past Medical History:  Diagnosis Date   ALLERGIC RHINITIS 05/12/2007   Qualifier: Diagnosis of  By: Sherren Mocha, RN, Ellen     Back pain    COVID-19 virus infection 09/20/2020   Elevated transaminase level 2009-2013   suspect NASH (2017)-pt preferred to simply start statin and skip abd u/s.  Viral Hep serologies neg.   Hyperlipidemia    Atorva intolerant (myalgias).  Rosuva 50m started 06/2017-intol.  pt tolerates zetia   Hypertension    Joint pain    Lower extremity edema    Obesity, Class II, BMI 35-39.9, with comorbidity    Osteoarthritis of both hips    Staged bilat THA 2021   Other malaise and fatigue 2011   Testosterone normal 2011   Prediabetes    Recurrent sinusitis 11/16/2011    Past Surgical History:  Procedure Laterality Date   SHOULDER SURGERY  2013; 2016   Left rotator cuff and biceps tendon repair (s/p fall)-2013, left.  Right 2016.   TOTAL HIP ARTHROPLASTY Bilateral 12/2019; 07/2020   Staged; Left, followed by Right 6 mo later.  Dr. SDelfino Lovett   Outpatient Medications Prior to Visit  Medication Sig Dispense Refill   Ascorbic Acid (VITAMIN C PO) Take by mouth daily.     cetirizine (ZYRTEC) 10 MG tablet TAKE 1 TABLET BY MOUTH EVERY DAY 90 tablet 3   ezetimibe (ZETIA) 10 MG tablet Take 1 tablet (10 mg total) by mouth daily. 90 tablet 3    lisinopril-hydrochlorothiazide (ZESTORETIC) 20-25 MG tablet Take 1 tablet by mouth daily. 90 tablet 1   metoprolol succinate (TOPROL-XL) 25 MG 24 hr tablet Take 1 tablet (25 mg total) by mouth daily. 90 tablet 3   Multiple Vitamin (MULTIVITAMIN) tablet Take 1 tablet by mouth daily.     Multiple Vitamins-Minerals (ZINC PO) Take by mouth daily.     tirzepatide (MOUNJARO) 7.5 MG/0.5ML Pen Inject 7.5 mg into the skin once a week. 2 mL 0   tirzepatide (MOUNJARO) 5 MG/0.5ML Pen Inject 5 mg into the skin once a week. 2 mL 0   No facility-administered medications prior to visit.    No Known Allergies  ROS As per HPI  PE:    02/12/2022    9:00 AM 12/25/2021    8:04 AM 12/16/2021   12:00 PM  Vitals with BMI  Height 5' 10"  5' 10"  5' 10"   Weight 247 lbs 10 oz 255 lbs 3 oz 251 lbs  BMI 35.53 [phone] Systolic [id number] Diastolic 85 77 79  Pulse 85 68 65     Physical Exam  VS: noted--normal. Gen: alert, NAD, NONTOXIC APPEARING. HEENT: eyes without injection, drainage, or swelling.  Ears: EACs clear, TMs with normal light reflex and landmarks.  Nose: Clear rhinorrhea, with  some dried, crusty exudate adherent to mildly injected mucosa.  No purulent d/c.  No paranasal sinus TTP.  No facial swelling.  Throat and mouth without focal lesion.  No pharyngial swelling, erythema, or exudate.   Neck: supple, no LAD.   LUNGS: CTA bilat, nonlabored resps.   CV: RRR, no m/r/g. EXT: no c/c/e SKIN: no rash  LABS:  Last CBC Lab Results  Component Value Date   WBC 6.9 06/25/2021   HGB 15.2 06/25/2021   HCT 46.6 06/25/2021   MCV 86.4 06/25/2021   RDW 15.4 06/25/2021   PLT 252.0 00/93/8182   Last metabolic panel Lab Results  Component Value Date   GLUCOSE 86 12/25/2021   NA 133 (L) 12/25/2021   K 3.5 12/25/2021   CL 99 12/25/2021   CO2 28 12/25/2021   BUN 18 12/25/2021   CREATININE 1.08 12/25/2021   CALCIUM 9.2 12/25/2021   PROT 6.5 12/25/2021   ALBUMIN 4.5 12/25/2021   BILITOT 0.7  12/25/2021   ALKPHOS 58 12/25/2021   AST 33 12/25/2021   ALT 56 (H) 12/25/2021   Covid test today: positive  IMPRESSION AND PLAN:  COVID-19 respiratory infection.  COVID-19 test positive at home last night and again here this morning. Patient's risk factor for complications from COVID: BMI greater than 35. GFR 78 ml/min 6 wks ago.   Paxlovid rx'd today. Quarantine precautions discussed.  Of note, he has received all recommended COVID vaccinations.  An After Visit Summary was printed and given to the patient.  FOLLOW UP: Return if symptoms worsen or fail to improve.  Signed:  Crissie Sickles, MD           02/12/2022

## 2022-02-12 NOTE — Telephone Encounter (Signed)
Isolation can be discontinued at least 5 days after symptom onset (day 0 is the day symptoms appeared, and day 1 is the next full day thereafter) if fever has resolved for at least 24 hours (without taking fever-reducing medications) and other symptoms are improving.  Please review and advise if any other suggestions

## 2022-02-12 NOTE — Telephone Encounter (Signed)
Pt has scheduled appt with PCP  [facility] - Client Nonclinical Telephone Record  [facility] - Client Client Site [place] - Day Provider Crissie Sickles - MD Contact Type Call Who Is Calling Patient / Member / Family / Caregiver Caller Name Cyree Chuong Caller Phone Number [phone] Patient Name Vernon Mccormick Patient DOB 01/30/1966 Call Type Message Only Information Provided Reason for Call Request to Schedule Office Appointment Initial Comment Caller states he needs to make an appt. for a Covid test. Disp. Time Disposition Final User 02/12/2022 8:07:44 AM General Information Provided Yes Green, Amy Call Closed By: Philis Kendall Transaction Date/Time: 02/12/2022 8:03:59 AM (ET

## 2022-02-12 NOTE — Telephone Encounter (Signed)
I agree

## 2022-04-13 ENCOUNTER — Telehealth: Payer: Self-pay

## 2022-04-13 NOTE — Telephone Encounter (Signed)
Form completed. Original scanned to e-mail provided on form. LM for pt to return call for pick up at front desk

## 2022-04-13 NOTE — Telephone Encounter (Signed)
Pts wife dropped off employee CPE form to be completed. Forms placed in PCP basket

## 2022-04-14 NOTE — Telephone Encounter (Signed)
LM for pt to return call for pick up at front desk

## 2022-04-15 NOTE — Telephone Encounter (Signed)
Spoke with pt regarding form, he confirmed form was picked up yesterday.

## 2022-04-22 ENCOUNTER — Encounter (INDEPENDENT_AMBULATORY_CARE_PROVIDER_SITE_OTHER): Payer: Self-pay

## 2022-05-25 DIAGNOSIS — Z760 Encounter for issue of repeat prescription: Secondary | ICD-10-CM | POA: Diagnosis not present

## 2022-05-25 DIAGNOSIS — E669 Obesity, unspecified: Secondary | ICD-10-CM | POA: Diagnosis not present

## 2022-06-04 ENCOUNTER — Other Ambulatory Visit: Payer: Self-pay | Admitting: Family Medicine

## 2022-06-25 ENCOUNTER — Ambulatory Visit (INDEPENDENT_AMBULATORY_CARE_PROVIDER_SITE_OTHER): Payer: BC Managed Care – PPO | Admitting: Family Medicine

## 2022-06-25 ENCOUNTER — Encounter: Payer: Self-pay | Admitting: Family Medicine

## 2022-06-25 VITALS — BP 117/77 | HR 67 | Temp 97.7°F | Ht 71.0 in | Wt 231.8 lb

## 2022-06-25 DIAGNOSIS — Z125 Encounter for screening for malignant neoplasm of prostate: Secondary | ICD-10-CM

## 2022-06-25 DIAGNOSIS — E7800 Pure hypercholesterolemia, unspecified: Secondary | ICD-10-CM

## 2022-06-25 DIAGNOSIS — I10 Essential (primary) hypertension: Secondary | ICD-10-CM

## 2022-06-25 DIAGNOSIS — Z0000 Encounter for general adult medical examination without abnormal findings: Secondary | ICD-10-CM

## 2022-06-25 DIAGNOSIS — Z1211 Encounter for screening for malignant neoplasm of colon: Secondary | ICD-10-CM

## 2022-06-25 DIAGNOSIS — R7303 Prediabetes: Secondary | ICD-10-CM | POA: Diagnosis not present

## 2022-06-25 DIAGNOSIS — E119 Type 2 diabetes mellitus without complications: Secondary | ICD-10-CM

## 2022-06-25 LAB — COMPREHENSIVE METABOLIC PANEL
ALT: 28 U/L (ref 0–53)
AST: 23 U/L (ref 0–37)
Albumin: 4.5 g/dL (ref 3.5–5.2)
Alkaline Phosphatase: 78 U/L (ref 39–117)
BUN: 16 mg/dL (ref 6–23)
CO2: 28 mEq/L (ref 19–32)
Calcium: 9.4 mg/dL (ref 8.4–10.5)
Chloride: 100 mEq/L (ref 96–112)
Creatinine, Ser: 0.96 mg/dL (ref 0.40–1.50)
GFR: 90.02 mL/min (ref >60.00)
Glucose, Bld: 96 mg/dL (ref 70–99)
Potassium: 3.8 mEq/L (ref 3.5–5.1)
Sodium: 136 mEq/L (ref 135–145)
Total Bilirubin: 0.6 mg/dL (ref 0.2–1.2)
Total Protein: 6.9 g/dL (ref 6.0–8.3)

## 2022-06-25 LAB — CBC
HCT: 47 % (ref 39.0–52.0)
Hemoglobin: 15.9 g/dL (ref 13.0–17.0)
MCHC: 33.8 g/dL (ref 30.0–36.0)
MCV: 86.2 fl (ref 78.0–100.0)
Platelets: 242 10*3/uL (ref 150.0–400.0)
RBC: 5.46 Mil/uL (ref 4.22–5.81)
RDW: 15 % (ref 11.5–15.5)
WBC: 6.6 10*3/uL (ref 4.0–10.5)

## 2022-06-25 LAB — TSH: TSH: 2.01 u[IU]/mL (ref 0.35–5.50)

## 2022-06-25 LAB — LIPID PANEL
Cholesterol: 184 mg/dL (ref 0–200)
HDL: 43.8 mg/dL (ref >39.00)
LDL Cholesterol: 120 mg/dL — ABNORMAL HIGH (ref 0–99)
NonHDL: 140.12
Total CHOL/HDL Ratio: 4
Triglycerides: 100 mg/dL (ref 0.0–149.0)
VLDL: 20 mg/dL (ref 0.0–40.0)

## 2022-06-25 LAB — PSA: PSA: 0.28 ng/mL (ref 0.10–4.00)

## 2022-06-25 LAB — HEMOGLOBIN A1C: Hgb A1c MFr Bld: 6 % (ref 4.6–6.5)

## 2022-06-25 MED ORDER — METOPROLOL SUCCINATE ER 25 MG PO TB24: 25.0000 mg | ORAL_TABLET | Freq: Every day | ORAL | 1 refills | Status: DC

## 2022-06-25 MED ORDER — LISINOPRIL-HYDROCHLOROTHIAZIDE 20-25 MG PO TABS: 1.0000 | ORAL_TABLET | Freq: Every day | ORAL | 1 refills | Status: DC

## 2022-06-25 MED ORDER — EZETIMIBE 10 MG PO TABS: 10.0000 mg | ORAL_TABLET | Freq: Every day | ORAL | 1 refills | Status: DC

## 2022-06-25 NOTE — Progress Notes (Signed)
Office Note 06/25/2022  CC:  Chief Complaint  Patient presents with   Annual Exam    Pt is fasting    HPI:  Patient is a 56 y.o. male who is here for annual health maintenance exam and 65-monthfollow-up hypertension, hyperlipidemia, and prediabetes.  He is feeling very well.  More active, purposeful wt loss going well.  His goal wt is 210 lbs. Mounjaro dose 152mqweek for the last 3 wks. Followed by Dr. WaJuleen Chinaor this med.  Past Medical History:  Diagnosis Date   ALLERGIC RHINITIS 05/12/2007   Qualifier: Diagnosis of  By: ToSherren MochaRN, Ellen     Back pain    COVID-19 virus infection 09/20/2020   Elevated transaminase level 2009-2013   suspect NASH (2017)-pt preferred to simply start statin and skip abd u/s.  Viral Hep serologies neg.   Hyperlipidemia    Atorva intolerant (myalgias).  Rosuva 10110mtarted 06/2017-intol.  pt tolerates zetia   Hypertension    Joint pain    Lower extremity edema    Obesity, Class II, BMI 35-39.9, with comorbidity    Osteoarthritis of both hips    Staged bilat THA 2021   Other malaise and fatigue 2011   Testosterone normal 2011   Prediabetes    Recurrent sinusitis 11/16/2011    Past Surgical History:  Procedure Laterality Date   SHOULDER SURGERY  2013; 2016   Left rotator cuff and biceps tendon repair (s/p fall)-2013, left.  Right 2016.   TOTAL HIP ARTHROPLASTY Bilateral 12/2019; 07/2020   Staged; Left, followed by Right 6 mo later.  Dr. SwiDelfino Lovett Family History  Problem Relation Age of Onset   Stroke Mother    Hyperlipidemia Mother    Hypertension Mother    Alzheimer's disease Mother    Celiac disease Mother    Osteoporosis Mother    Hyperlipidemia Father    Hypertension Father    Stroke Paternal Grandfather    Heart disease Paternal Grandfather    Hypertension Paternal Grandfather    Diabetes Paternal Grandfather    Kidney disease Paternal Grandfather     Social History   Socioeconomic History   Marital status:  Married    Spouse name: JulAlmyra FreeNumber of children: 2   Years of education: HS   Highest education level: Not on file  Occupational History    Employer: OTHER    Comment: [employer]   Occupation: purGeologist, engineeringobacco Use   Smoking status: Never   Smokeless tobacco: Former    Types: CheNurse, children'se: Never used  Substance and Sexual Activity   Alcohol use: Yes    Comment: socially   Drug use: No   Sexual activity: Not on file  Other Topics Concern   Not on file  Social History Narrative   Married, 2 kids.   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  Rare/social ETOH.  No drugs.   Social Determinants of Health   Financial Resource Strain: Not on file  Food Insecurity: Not on file  Transportation Needs: Not on file  Physical Activity: Not on file  Stress: Not on file  Social Connections: Not on file  Intimate Partner Violence: Not on file    Outpatient Medications Prior to Visit  Medication Sig Dispense Refill   Ascorbic Acid (VITAMIN C PO) Take by mouth daily.  cetirizine (ZYRTEC) 10 MG tablet TAKE 1 TABLET BY MOUTH EVERY DAY 90 tablet 1   Multiple Vitamin (MULTIVITAMIN) tablet Take 1 tablet by mouth daily.     Multiple Vitamins-Minerals (ZINC PO) Take by mouth daily.     tirzepatide (MOUNJARO) 7.5 MG/0.5ML Pen Inject 7.5 mg into the skin once a week. (Patient taking differently: Inject 10 mg into the skin once a week.) 2 mL 0   ezetimibe (ZETIA) 10 MG tablet Take 1 tablet (10 mg total) by mouth daily. 90 tablet 3   lisinopril-hydrochlorothiazide (ZESTORETIC) 20-25 MG tablet Take 1 tablet by mouth daily. 90 tablet 1   metoprolol succinate (TOPROL-XL) 25 MG 24 hr tablet Take 1 tablet (25 mg total) by mouth daily. 90 tablet 3   No facility-administered medications prior to visit.    No Known Allergies  ROS Review of Systems  Constitutional:  Negative for appetite change, chills,  fatigue and fever.  HENT:  Negative for congestion, dental problem, ear pain and sore throat.   Eyes:  Negative for discharge, redness and visual disturbance.  Respiratory:  Negative for cough, chest tightness, shortness of breath and wheezing.   Cardiovascular:  Negative for chest pain, palpitations and leg swelling.  Gastrointestinal:  Negative for abdominal pain, blood in stool, diarrhea, nausea and vomiting.  Genitourinary:  Negative for difficulty urinating, dysuria, flank pain, frequency, hematuria and urgency.  Musculoskeletal:  Negative for arthralgias, back pain, joint swelling, myalgias and neck stiffness.  Skin:  Negative for pallor and rash.  Neurological:  Negative for dizziness, speech difficulty, weakness and headaches.  Hematological:  Negative for adenopathy. Does not bruise/bleed easily.  Psychiatric/Behavioral:  Negative for confusion and sleep disturbance. The patient is not nervous/anxious.    PE;    06/25/2022    8:27 AM 02/12/2022    9:00 AM 12/25/2021    8:04 AM  Vitals with BMI  Height 5' 11"  5' 10"  5' 10"   Weight 231 lbs 13 oz 247 lbs 10 oz 255 lbs 3 oz  BMI 32.34 54.65 68.12  Systolic 751 700 174  Diastolic 77 85 77  Pulse 67 85 68    Gen: Alert, well appearing.  Patient is oriented to person, place, time, and situation. AFFECT: pleasant, lucid thought and speech. ENT: Ears: EACs clear, normal epithelium.  TMs with good light reflex and landmarks bilaterally.  Eyes: no injection, icteris, swelling, or exudate.  EOMI, PERRLA. Nose: no drainage or turbinate edema/swelling.  No injection or focal lesion.  Mouth: lips without lesion/swelling.  Oral mucosa pink and moist.  Dentition intact and without obvious caries or gingival swelling.  Oropharynx without erythema, exudate, or swelling.  Neck: supple/nontender.  No LAD, mass, or TM.  Carotid pulses 2+ bilaterally, without bruits. CV: RRR, no m/r/g.   LUNGS: CTA bilat, nonlabored resps, good aeration in all lung  fields. ABD: soft, NT, ND, BS normal.  No hepatospenomegaly or mass.  No bruits. EXT: no clubbing, cyanosis, or edema.  Musculoskeletal: no joint swelling, erythema, warmth, or tenderness.  ROM of all joints intact. Skin - no sores or suspicious lesions or rashes or color changes  Pertinent labs:  Lab Results  Component Value Date   TSH 2.10 06/25/2021   Lab Results  Component Value Date   WBC 6.9 06/25/2021   HGB 15.2 06/25/2021   HCT 46.6 06/25/2021   MCV 86.4 06/25/2021   PLT 252.0 06/25/2021   Lab Results  Component Value Date   CREATININE 1.08 12/25/2021   BUN 18  12/25/2021   NA 133 (L) 12/25/2021   K 3.5 12/25/2021   CL 99 12/25/2021   CO2 28 12/25/2021   Lab Results  Component Value Date   ALT 56 (H) 12/25/2021   AST 33 12/25/2021   ALKPHOS 58 12/25/2021   BILITOT 0.7 12/25/2021   Lab Results  Component Value Date   CHOL 189 12/25/2021   Lab Results  Component Value Date   HDL 40.50 12/25/2021   Lab Results  Component Value Date   LDLCALC 115 (H) 12/25/2021   Lab Results  Component Value Date   TRIG 168.0 (H) 12/25/2021   Lab Results  Component Value Date   CHOLHDL 5 12/25/2021   Lab Results  Component Value Date   PSA 0.43 06/25/2021   PSA 0.34 06/12/2020   PSA 0.41 06/08/2019   Lab Results  Component Value Date   HGBA1C 5.9 12/25/2021   ASSESSMENT AND PLAN:   #1 hypertension, well controlled on lisinopril-HCTZ 20-25 and Toprol-XL 25 mg daily. Electrolytes and creatinine today.  2.  Hyperlipidemia, statin intolerant. Doing fine on Zetia 10 mg a day. Cholesterol panel and hepatic panel today.  3.  Prediabetes. Great weight loss with Darcel Bayley, has lost about 25 pounds in the last 6 months. Followed by Dr. Juleen China with [facility]. Hemoglobin A1c today.  4. Health maintenance exam: Reviewed age and gender appropriate health maintenance issues (prudent diet, regular exercise, health risks of tobacco and excessive alcohol, use of seatbelts,  fire alarms in home, use of sunscreen).  Also reviewed age and gender appropriate health screening as well as vaccine recommendations. Vaccines: flu->gets this via employer.  Shingrix declined. Otherwise all utd. Labs: fasting HP, PSA, point-of-care hemoglobin A1c Prostate ca screening: PSA. Colon ca screening: he has not had initial screening->GI referral today.  An After Visit Summary was printed and given to the patient.  FOLLOW UP:  Return in about 6 months (around 12/25/2022) for routine chronic illness f/u.  Signed:  Crissie Sickles, MD           06/25/2022

## 2022-06-25 NOTE — Patient Instructions (Signed)
Health Maintenance, Male Adopting a healthy lifestyle and getting preventive care are important in promoting health and wellness. Ask your health care provider about: The right schedule for you to have regular tests and exams. Things you can do on your own to prevent diseases and keep yourself healthy. What should I know about diet, weight, and exercise? Eat a healthy diet  Eat a diet that includes plenty of vegetables, fruits, low-fat dairy products, and lean protein. Do not eat a lot of foods that are high in solid fats, added sugars, or sodium. Maintain a healthy weight Body mass index (BMI) is a measurement that can be used to identify possible weight problems. It estimates body fat based on height and weight. Your health care provider can help determine your BMI and help you achieve or maintain a healthy weight. Get regular exercise Get regular exercise. This is one of the most important things you can do for your health. Most adults should: Exercise for at least 150 minutes each week. The exercise should increase your heart rate and make you sweat (moderate-intensity exercise). Do strengthening exercises at least twice a week. This is in addition to the moderate-intensity exercise. Spend less time sitting. Even light physical activity can be beneficial. Watch cholesterol and blood lipids Have your blood tested for lipids and cholesterol at 54 years of age, then have this test every 5 years. You may need to have your cholesterol levels checked more often if: Your lipid or cholesterol levels are high. You are older than 54 years of age. You are at high risk for heart disease. What should I know about cancer screening? Many types of cancers can be detected early and may often be prevented. Depending on your health history and family history, you may need to have cancer screening at various ages. This may include screening for: Colorectal cancer. Prostate cancer. Skin cancer. Lung  cancer. What should I know about heart disease, diabetes, and high blood pressure? Blood pressure and heart disease High blood pressure causes heart disease and increases the risk of stroke. This is more likely to develop in people who have high blood pressure readings or are overweight. Talk with your health care provider about your target blood pressure readings. Have your blood pressure checked: Every 3-5 years if you are 18-39 years of age. Every year if you are 40 years old or older. If you are between the ages of 65 and 75 and are a current or former smoker, ask your health care provider if you should have a one-time screening for abdominal aortic aneurysm (AAA). Diabetes Have regular diabetes screenings. This checks your fasting blood sugar level. Have the screening done: Once every three years after age 45 if you are at a normal weight and have a low risk for diabetes. More often and at a younger age if you are overweight or have a high risk for diabetes. What should I know about preventing infection? Hepatitis B If you have a higher risk for hepatitis B, you should be screened for this virus. Talk with your health care provider to find out if you are at risk for hepatitis B infection. Hepatitis C Blood testing is recommended for: Everyone born from 1945 through 1965. Anyone with known risk factors for hepatitis C. Sexually transmitted infections (STIs) You should be screened each year for STIs, including gonorrhea and chlamydia, if: You are sexually active and are younger than 54 years of age. You are older than 54 years of age and your   health care provider tells you that you are at risk for this type of infection. Your sexual activity has changed since you were last screened, and you are at increased risk for chlamydia or gonorrhea. Ask your health care provider if you are at risk. Ask your health care provider about whether you are at high risk for HIV. Your health care provider  may recommend a prescription medicine to help prevent HIV infection. If you choose to take medicine to prevent HIV, you should first get tested for HIV. You should then be tested every 3 months for as long as you are taking the medicine. Follow these instructions at home: Alcohol use Do not drink alcohol if your health care provider tells you not to drink. If you drink alcohol: Limit how much you have to 0-2 drinks a day. Know how much alcohol is in your drink. In the [place] one drink equals one 12 oz bottle of beer (355 mL), one 5 oz glass of wine (148 mL), or one 1 oz glass of hard liquor (44 mL). Lifestyle Do not use any products that contain nicotine or tobacco. These products include cigarettes, chewing tobacco, and vaping devices, such as e-cigarettes. If you need help quitting, ask your health care provider. Do not use street drugs. Do not share needles. Ask your health care provider for help if you need support or information about quitting drugs. General instructions Schedule regular health, dental, and eye exams. Stay current with your vaccines. Tell your health care provider if: You often feel depressed. You have ever been abused or do not feel safe at home. Summary Adopting a healthy lifestyle and getting preventive care are important in promoting health and wellness. Follow your health care provider's instructions about healthy diet, exercising, and getting tested or screened for diseases. Follow your health care provider's instructions on monitoring your cholesterol and blood pressure. This information is not intended to replace advice given to you by your health care provider. Make sure you discuss any questions you have with your health care provider. Document Revised: 01/20/2021 Document Reviewed: 01/20/2021 Elsevier Patient Education  2023 [employer]  

## 2022-08-24 DIAGNOSIS — E669 Obesity, unspecified: Secondary | ICD-10-CM | POA: Diagnosis not present

## 2022-08-24 DIAGNOSIS — E1169 Type 2 diabetes mellitus with other specified complication: Secondary | ICD-10-CM | POA: Diagnosis not present

## 2022-08-24 DIAGNOSIS — E65 Localized adiposity: Secondary | ICD-10-CM | POA: Diagnosis not present

## 2022-08-24 DIAGNOSIS — E785 Hyperlipidemia, unspecified: Secondary | ICD-10-CM | POA: Diagnosis not present

## 2022-12-08 ENCOUNTER — Other Ambulatory Visit: Payer: Self-pay | Admitting: Family Medicine

## 2022-12-22 ENCOUNTER — Other Ambulatory Visit: Payer: Self-pay | Admitting: Family Medicine

## 2022-12-28 ENCOUNTER — Ambulatory Visit: Payer: 59 | Admitting: Family Medicine

## 2023-01-09 ENCOUNTER — Other Ambulatory Visit: Payer: Self-pay | Admitting: Family Medicine

## 2023-02-23 DIAGNOSIS — E1169 Type 2 diabetes mellitus with other specified complication: Secondary | ICD-10-CM | POA: Diagnosis not present

## 2023-02-23 DIAGNOSIS — Z9189 Other specified personal risk factors, not elsewhere classified: Secondary | ICD-10-CM | POA: Diagnosis not present

## 2023-02-23 DIAGNOSIS — K5909 Other constipation: Secondary | ICD-10-CM | POA: Diagnosis not present

## 2023-02-23 DIAGNOSIS — E7849 Other hyperlipidemia: Secondary | ICD-10-CM | POA: Diagnosis not present

## 2023-03-05 NOTE — Patient Instructions (Signed)

## 2023-03-08 ENCOUNTER — Encounter: Payer: Self-pay | Admitting: Family Medicine

## 2023-03-08 ENCOUNTER — Ambulatory Visit (INDEPENDENT_AMBULATORY_CARE_PROVIDER_SITE_OTHER): Payer: BC Managed Care – PPO | Admitting: Family Medicine

## 2023-03-08 VITALS — BP 118/76 | HR 73 | Temp 98.7°F | Ht 71.0 in | Wt 234.0 lb

## 2023-03-08 DIAGNOSIS — I10 Essential (primary) hypertension: Secondary | ICD-10-CM

## 2023-03-08 DIAGNOSIS — E7800 Pure hypercholesterolemia, unspecified: Secondary | ICD-10-CM | POA: Diagnosis not present

## 2023-03-08 DIAGNOSIS — R7303 Prediabetes: Secondary | ICD-10-CM

## 2023-03-08 DIAGNOSIS — Z23 Encounter for immunization: Secondary | ICD-10-CM | POA: Diagnosis not present

## 2023-03-08 DIAGNOSIS — Z1211 Encounter for screening for malignant neoplasm of colon: Secondary | ICD-10-CM

## 2023-03-08 LAB — POCT GLYCOSYLATED HEMOGLOBIN (HGB A1C)
HbA1c POC (<> result, manual entry): 5.4 % (ref 4.0–5.6)
HbA1c, POC (controlled diabetic range): 5.4 % (ref 0.0–7.0)
HbA1c, POC (prediabetic range): 5.4 % — AB (ref 5.7–6.4)
Hemoglobin A1C: 5.4 % (ref 4.0–5.6)

## 2023-03-08 NOTE — Progress Notes (Signed)
OFFICE VISIT  03/08/2023  CC:  Chief Complaint  Patient presents with   Medical Management of Chronic Issues    Pt is fasting    Patient is a 57 y.o. male who presents for 59-month follow-up hypertension, hyperlipidemia, and prediabetes. A/P as of last visit: "1 hypertension, well controlled on lisinopril-HCTZ 20-25 and Toprol-XL 25 mg daily. Electrolytes and creatinine today.   2.  Hyperlipidemia, statin intolerant. Doing fine on Zetia 10 mg a day. Cholesterol panel and hepatic panel today.   3.  Prediabetes. Great weight loss with [employer]gg[employer] Keen, has lost about 25 pounds in the last 6 months. Followed by Dr. Earlene Plater with [facility]. Hemoglobin A1c today."  INTERIM HX: Vernon Mccormick feels well. He is excited about his progress with weight loss with Dr. Earlene Plater.  He had plateaued recently so his dose of Mounjaro was increased to 12.5 mg weekly. He got labs done with her at that time and we will have him get these to [place].  I reviewed them on his phone today and they were all normal except his lipid panel.  ROS as above, plus--> no fevers, no CP, no SOB, no wheezing, no cough, no dizziness, no HAs, no rashes, no melena/hematochezia.  No polyuria or polydipsia.  No myalgias or arthralgias.  No focal weakness, paresthesias, or tremors.  No acute vision or hearing abnormalities.  No dysuria or unusual/new urinary urgency or frequency.  No recent changes in lower legs. No n/v/d or abd pain.  No palpitations.    Past Medical History:  Diagnosis Date   ALLERGIC RHINITIS 05/12/2007   Qualifier: Diagnosis of  By: Tawanna Cooler, RN, Ellen     Back pain    COVID-19 virus infection 09/20/2020   Elevated transaminase level 2009-2013   suspect NASH (2017)-pt preferred to simply start statin and skip abd u/s.  Viral Hep serologies neg.   Hyperlipidemia    Atorva intolerant (myalgias).  Rosuva 10mg  started 06/2017-intol.  pt tolerates zetia   Hypertension    Joint pain    Lower extremity edema    Obesity, Class II,  BMI 35-39.9, with comorbidity    Osteoarthritis of both hips    Staged bilat THA 2021   Other malaise and fatigue 2011   Testosterone normal 2011   Prediabetes    Recurrent sinusitis 11/16/2011    Past Surgical History:  Procedure Laterality Date   SHOULDER SURGERY  2013; 2016   Left rotator cuff and biceps tendon repair (s/p fall)-2013, left.  Right 2016.   TOTAL HIP ARTHROPLASTY Bilateral 12/2019; 07/2020   Staged; Left, followed by Right 6 mo later.  Dr. Veda Canning    Outpatient Medications Prior to Visit  Medication Sig Dispense Refill   MOUNJARO 12.5 MG/0.5ML Pen Inject 12.5 mg into the skin once a week.     Ascorbic Acid (VITAMIN C PO) Take by mouth daily.     cetirizine (ZYRTEC) 10 MG tablet TAKE 1 TABLET BY MOUTH EVERY DAY 90 tablet 1   ezetimibe (ZETIA) 10 MG tablet TAKE 1 TABLET BY MOUTH EVERY DAY 90 tablet 1   lisinopril-hydrochlorothiazide (ZESTORETIC) 20-25 MG tablet TAKE 1 TABLET BY MOUTH EVERY DAY 90 tablet 0   metoprolol succinate (TOPROL-XL) 25 MG 24 hr tablet Take 1 tablet (25 mg total) by mouth daily. 90 tablet 1   Multiple Vitamin (MULTIVITAMIN) tablet Take 1 tablet by mouth daily.     Multiple Vitamins-Minerals (ZINC PO) Take by mouth daily.     tirzepatide (MOUNJARO) 7.5 MG/0.5ML Pen Inject 7.5 mg  into the skin once a week. (Patient taking differently: Inject 12.5 mg into the skin once a week.) 2 mL 0   No facility-administered medications prior to visit.    No Known Allergies  Review of Systems As per HPI  PE:    03/08/2023    8:56 AM 06/25/2022    8:27 AM 02/12/2022    9:00 AM  Vitals with BMI  Height 5\' 11"  5\' 11"  5\' 10"   Weight 234 lbs 231 lbs 13 oz 247 lbs 10 oz  BMI 32.65 32.34 35.53  Systolic 118 117 409  Diastolic 76 77 85  Pulse 73 67 85     Physical Exam  Gen: Alert, well appearing.  Patient is oriented to person, place, time, and situation. AFFECT: pleasant, lucid thought and speech. No further exam today  LABS:  Last CBC Lab  Results  Component Value Date   WBC 6.6 06/25/2022   HGB 15.9 06/25/2022   HCT 47.0 06/25/2022   MCV 86.2 06/25/2022   RDW 15.0 06/25/2022   PLT 242.0 06/25/2022   Last metabolic panel Lab Results  Component Value Date   GLUCOSE 96 06/25/2022   NA 136 06/25/2022   K 3.8 06/25/2022   CL 100 06/25/2022   CO2 28 06/25/2022   BUN 16 06/25/2022   CREATININE 0.96 06/25/2022   CALCIUM 9.4 06/25/2022   PROT 6.9 06/25/2022   ALBUMIN 4.5 06/25/2022   BILITOT 0.6 06/25/2022   ALKPHOS 78 06/25/2022   AST 23 06/25/2022   ALT 28 06/25/2022   Last lipids Lab Results  Component Value Date   CHOL 184 06/25/2022   HDL 43.80 06/25/2022   LDLCALC 120 (H) 06/25/2022   LDLDIRECT 172.1 11/08/2013   TRIG 100.0 06/25/2022   CHOLHDL 4 06/25/2022   Last hemoglobin A1c Lab Results  Component Value Date   HGBA1C 5.4 03/08/2023   HGBA1C 5.4 03/08/2023   HGBA1C 5.4 (A) 03/08/2023   HGBA1C 5.4 03/08/2023   IMPRESSION AND PLAN:  No problem-specific Assessment & Plan notes found for this encounter.  1) Prediabetes.  Doing great with diet/exercise, mounjaro for wt mgmt (Dr. Earlene Plater). POC Hba1c today is 5.3%.  2) HTN, well controlled on lisinopril-HCTZ 20-25 daily and Toprol-XL 25 mg daily.  #3 hypercholesterolemia.  Statin intolerant. He is on Zetia and tolerates this well but admits he only takes it occasionally. He will try to get more regular/compliant with this. Review of labs from Dr. Earlene Plater 2 weeks ago shows his LDL of 143. His complete metabolic panel was normal at that time.  #4 colon cancer screening--> referred to gastroenterology today for colonoscopy.  An After Visit Summary was printed and given to the patient.  FOLLOW UP: Return in about 4 months (around 07/08/2023) for annual CPE (fasting). Next CPE 06/2023  Signed:  Santiago Bumpers, MD           03/08/2023

## 2023-04-27 ENCOUNTER — Other Ambulatory Visit: Payer: Self-pay | Admitting: Family Medicine

## 2023-06-24 ENCOUNTER — Other Ambulatory Visit: Payer: Self-pay | Admitting: Family Medicine

## 2023-06-28 NOTE — Patient Instructions (Signed)

## 2023-06-29 ENCOUNTER — Ambulatory Visit (INDEPENDENT_AMBULATORY_CARE_PROVIDER_SITE_OTHER): Payer: BC Managed Care – PPO | Admitting: Family Medicine

## 2023-06-29 ENCOUNTER — Encounter: Payer: Self-pay | Admitting: Family Medicine

## 2023-06-29 VITALS — BP 129/82 | HR 76 | Ht 71.0 in | Wt 224.6 lb

## 2023-06-29 DIAGNOSIS — G720 Drug-induced myopathy: Secondary | ICD-10-CM | POA: Diagnosis not present

## 2023-06-29 DIAGNOSIS — E7800 Pure hypercholesterolemia, unspecified: Secondary | ICD-10-CM

## 2023-06-29 DIAGNOSIS — Z0001 Encounter for general adult medical examination with abnormal findings: Secondary | ICD-10-CM | POA: Diagnosis not present

## 2023-06-29 DIAGNOSIS — R7303 Prediabetes: Secondary | ICD-10-CM | POA: Diagnosis not present

## 2023-06-29 DIAGNOSIS — I10 Essential (primary) hypertension: Secondary | ICD-10-CM | POA: Diagnosis not present

## 2023-06-29 DIAGNOSIS — Z23 Encounter for immunization: Secondary | ICD-10-CM | POA: Diagnosis not present

## 2023-06-29 DIAGNOSIS — T466X5A Adverse effect of antihyperlipidemic and antiarteriosclerotic drugs, initial encounter: Secondary | ICD-10-CM | POA: Diagnosis not present

## 2023-06-29 DIAGNOSIS — Z125 Encounter for screening for malignant neoplasm of prostate: Secondary | ICD-10-CM

## 2023-06-29 DIAGNOSIS — Z0000 Encounter for general adult medical examination without abnormal findings: Secondary | ICD-10-CM

## 2023-06-29 DIAGNOSIS — Z1211 Encounter for screening for malignant neoplasm of colon: Secondary | ICD-10-CM

## 2023-06-29 LAB — LIPID PANEL
Cholesterol: 166 mg/dL (ref 0–200)
HDL: 43.6 mg/dL (ref >39.00)
LDL Cholesterol: 103 mg/dL — ABNORMAL HIGH (ref 0–99)
NonHDL: 122.34
Total CHOL/HDL Ratio: 4
Triglycerides: 97 mg/dL (ref 0.0–149.0)
VLDL: 19.4 mg/dL (ref 0.0–40.0)

## 2023-06-29 LAB — COMPREHENSIVE METABOLIC PANEL
ALT: 23 U/L (ref 0–53)
AST: 21 U/L (ref 0–37)
Albumin: 4.6 g/dL (ref 3.5–5.2)
Alkaline Phosphatase: 65 U/L (ref 39–117)
BUN: 18 mg/dL (ref 6–23)
CO2: 29 meq/L (ref 19–32)
Calcium: 9.5 mg/dL (ref 8.4–10.5)
Chloride: 99 meq/L (ref 96–112)
Creatinine, Ser: 0.99 mg/dL (ref 0.40–1.50)
GFR: 86.14 mL/min (ref >60.00)
Glucose, Bld: 91 mg/dL (ref 70–99)
Potassium: 3.8 meq/L (ref 3.5–5.1)
Sodium: 136 meq/L (ref 135–145)
Total Bilirubin: 0.6 mg/dL (ref 0.2–1.2)
Total Protein: 7 g/dL (ref 6.0–8.3)

## 2023-06-29 LAB — CBC WITH DIFFERENTIAL/PLATELET
Basophils Absolute: 0.1 10*3/uL (ref 0.0–0.1)
Basophils Relative: 0.9 % (ref 0.0–3.0)
Eosinophils Absolute: 0.1 10*3/uL (ref 0.0–0.7)
Eosinophils Relative: 1.1 % (ref 0.0–5.0)
HCT: 48.5 % (ref 39.0–52.0)
Hemoglobin: 16 g/dL (ref 13.0–17.0)
Lymphocytes Relative: 32.7 % (ref 12.0–46.0)
Lymphs Abs: 2.4 10*3/uL (ref 0.7–4.0)
MCHC: 32.9 g/dL (ref 30.0–36.0)
MCV: 88.4 fL (ref 78.0–100.0)
Monocytes Absolute: 0.5 10*3/uL (ref 0.1–1.0)
Monocytes Relative: 6.2 % (ref 3.0–12.0)
Neutro Abs: 4.3 10*3/uL (ref 1.4–7.7)
Neutrophils Relative %: 59.1 % (ref 43.0–77.0)
Platelets: 245 10*3/uL (ref 150.0–400.0)
RBC: 5.48 Mil/uL (ref 4.22–5.81)
RDW: 14.3 % (ref 11.5–15.5)
WBC: 7.3 10*3/uL (ref 4.0–10.5)

## 2023-06-29 LAB — POCT GLYCOSYLATED HEMOGLOBIN (HGB A1C)
HbA1c POC (<> result, manual entry): 5.3 % (ref 4.0–5.6)
HbA1c, POC (controlled diabetic range): 5.3 % (ref 0.0–7.0)
HbA1c, POC (prediabetic range): 5.3 % — AB (ref 5.7–6.4)
Hemoglobin A1C: 5.3 % (ref 4.0–5.6)

## 2023-06-29 LAB — TSH: TSH: 2.14 u[IU]/mL (ref 0.35–5.50)

## 2023-06-29 LAB — PSA: PSA: 0.42 ng/mL (ref 0.10–4.00)

## 2023-06-29 MED ORDER — METOPROLOL SUCCINATE ER 25 MG PO TB24: 25.0000 mg | ORAL_TABLET | Freq: Every day | ORAL | 1 refills | Status: AC

## 2023-06-29 MED ORDER — EZETIMIBE 10 MG PO TABS: 10.0000 mg | ORAL_TABLET | Freq: Every day | ORAL | 1 refills | Status: DC

## 2023-06-29 MED ORDER — LISINOPRIL-HYDROCHLOROTHIAZIDE 20-25 MG PO TABS: 1.0000 | ORAL_TABLET | Freq: Every day | ORAL | 1 refills | Status: DC

## 2023-06-29 NOTE — Progress Notes (Signed)
Office Note 06/29/2023  CC:  Chief Complaint  Patient presents with   Annual Exam    Pt is fasting. Pt has not taken meds this morning.  Interested in referral for colonoscopy   Patient is a 57 y.o. male who is here for annual health maintenance exam and 45-month follow-up hypertension, hyperlipidemia, and prediabetes. A/P as of last visit: "1) Prediabetes.  Doing great with diet/exercise, mounjaro for wt mgmt (Dr. Earlene Plater). POC Hba1c today is 5.3%.   2) HTN, well controlled on lisinopril-HCTZ 20-25 daily and Toprol-XL 25 mg daily.   #3 hypercholesterolemia.  Statin intolerant. He is on Zetia and tolerates this well but admits he only takes it occasionally. He will try to get more regular/compliant with this. Review of labs from Dr. Earlene Plater 2 weeks ago shows his LDL of 143. His complete metabolic panel was normal at that time.   #4 colon cancer screening--> referred to gastroenterology today for colonoscopy."  INTERIM HX: Vernon Mccormick is feeling well. He works out regularly and is eating healthy.  No acute concerns.   Past Medical History:  Diagnosis Date   ALLERGIC RHINITIS 05/12/2007   Qualifier: Diagnosis of  By: Tawanna Cooler, RN, Ellen     Back pain    COVID-19 virus infection 09/20/2020   Elevated transaminase level 2009-2013   suspect NASH (2017)-pt preferred to simply start statin and skip abd u/s.  Viral Hep serologies neg.   Hyperlipidemia    Atorva intolerant (myalgias).  Rosuva 10mg  started 06/2017-intol.  pt tolerates zetia   Hypertension    Joint pain    Lower extremity edema    Obesity, Class II, BMI 35-39.9, with comorbidity    Osteoarthritis of both hips    Staged bilat THA 2021   Other malaise and fatigue 2011   Testosterone normal 2011   Prediabetes    Recurrent sinusitis 11/16/2011    Past Surgical History:  Procedure Laterality Date   SHOULDER SURGERY  2013; 2016   Left rotator cuff and biceps tendon repair (s/p fall)-2013, left.  Right 2016.    TOTAL HIP ARTHROPLASTY Bilateral 12/2019; 07/2020   Staged; Left, followed by Right 6 mo later.  Dr. Veda Canning    Family History  Problem Relation Age of Onset   Stroke Mother    Hyperlipidemia Mother    Hypertension Mother    Alzheimer's disease Mother    Celiac disease Mother    Osteoporosis Mother    Hyperlipidemia Father    Hypertension Father    Stroke Paternal Grandfather    Heart disease Paternal Grandfather    Hypertension Paternal Grandfather    Diabetes Paternal Grandfather    Kidney disease Paternal Grandfather     Social History   Socioeconomic History   Marital status: Married    Spouse name: Raynelle Fanning   Number of children: 2   Years of education: HS   Highest education level: Not on file  Occupational History    Employer: OTHER    Comment: [employer]   Occupation: Technical brewer  Tobacco Use   Smoking status: Never   Smokeless tobacco: Former    Types: Associate Professor status: Never Used  Substance and Sexual Activity   Alcohol use: Yes    Comment: socially   Drug use: No   Sexual activity: Not on file  Other Topics Concern   Not on file  Social History Narrative   Married, 2 kids.   Long time local resident, grad of [employer] 1988.  Self employed: sells parts to Engineer, building services.   No Tob.  Rare/social ETOH.  No drugs.   Social Determinants of Health   Financial Resource Strain: Not on file  Food Insecurity: Not on file  Transportation Needs: Not on file  Physical Activity: Not on file  Stress: Not on file  Social Connections: Unknown (01/25/2022)   Received from [facility], [facility]   Social Network    Social Network: Not on file  Intimate Partner Violence: Unknown (12/17/2021)   Received from [facility], [facility]   HITS    Physically Hurt: Not on file    Insult or Talk Down To: Not on file    Threaten Physical Harm: Not on file    Scream or Curse: Not on file    Outpatient Medications Prior to Visit  Medication  Sig Dispense Refill   Ascorbic Acid (VITAMIN C PO) Take by mouth daily.     cetirizine (ZYRTEC) 10 MG tablet Take 1 tablet (10 mg total) by mouth daily. MUST KEEP APPT FOR FURTHER REFILLS 30 tablet 0   MOUNJARO 12.5 MG/0.5ML Pen Inject 12.5 mg into the skin once a week.     Multiple Vitamin (MULTIVITAMIN) tablet Take 1 tablet by mouth daily.     Multiple Vitamins-Minerals (ZINC PO) Take by mouth daily.     ezetimibe (ZETIA) 10 MG tablet TAKE 1 TABLET BY MOUTH EVERY DAY 90 tablet 1   lisinopril-hydrochlorothiazide (ZESTORETIC) 20-25 MG tablet TAKE 1 TABLET BY MOUTH EVERY DAY 90 tablet 0   metoprolol succinate (TOPROL-XL) 25 MG 24 hr tablet Take 1 tablet (25 mg total) by mouth daily. (Patient not taking: Reported on 06/29/2023) 90 tablet 1   No facility-administered medications prior to visit.    No Known Allergies  Review of Systems  Constitutional:  Negative for appetite change, chills, fatigue and fever.  HENT:  Negative for congestion, dental problem, ear pain and sore throat.   Eyes:  Negative for discharge, redness and visual disturbance.  Respiratory:  Negative for cough, chest tightness, shortness of breath and wheezing.   Cardiovascular:  Negative for chest pain, palpitations and leg swelling.  Gastrointestinal:  Negative for abdominal pain, blood in stool, diarrhea, nausea and vomiting.  Genitourinary:  Negative for difficulty urinating, dysuria, flank pain, frequency, hematuria and urgency.  Musculoskeletal:  Negative for arthralgias, back pain, joint swelling, myalgias and neck stiffness.  Skin:  Negative for pallor and rash.  Neurological:  Negative for dizziness, speech difficulty, weakness and headaches.  Hematological:  Negative for adenopathy. Does not bruise/bleed easily.  Psychiatric/Behavioral:  Negative for confusion and sleep disturbance. The patient is not nervous/anxious.     PE;    06/29/2023   10:41 AM 06/29/2023   10:39 AM 03/08/2023    8:56 AM  Vitals with  BMI  Height  5\' 11"  5\' 11"   Weight  224 lbs 10 oz 234 lbs  BMI  31.34 32.65  Systolic 129 129 811  Diastolic 82 91 76  Pulse  76 73   Gen: Alert, well appearing.  Patient is oriented to person, place, time, and situation. AFFECT: pleasant, lucid thought and speech. ENT: Ears: EACs clear, normal epithelium.  TMs with good light reflex and landmarks bilaterally.  Eyes: no injection, icteris, swelling, or exudate.  EOMI, PERRLA. Nose: no drainage or turbinate edema/swelling.  No injection or focal lesion.  Mouth: lips without lesion/swelling.  Oral mucosa pink and moist.  Dentition intact and without obvious caries or gingival swelling.  Oropharynx without erythema, exudate, or swelling.  Neck: supple/nontender.  No LAD, mass, or TM.  Carotid pulses 2+ bilaterally, without bruits. CV: RRR, no m/r/g.   LUNGS: CTA bilat, nonlabored resps, good aeration in all lung fields. ABD: soft, NT, ND, BS normal.  No hepatospenomegaly or mass.  No bruits. EXT: no clubbing, cyanosis, or edema.  Musculoskeletal: no joint swelling, erythema, warmth, or tenderness.  ROM of all joints intact. Skin - no sores or suspicious lesions or rashes or color changes  Pertinent labs:  Lab Results  Component Value Date   TSH 2.01 06/25/2022   Lab Results  Component Value Date   WBC 6.6 06/25/2022   HGB 15.9 06/25/2022   HCT 47.0 06/25/2022   MCV 86.2 06/25/2022   PLT 242.0 06/25/2022   Lab Results  Component Value Date   CREATININE 0.96 06/25/2022   BUN 16 06/25/2022   NA 136 06/25/2022   K 3.8 06/25/2022   CL 100 06/25/2022   CO2 28 06/25/2022   Lab Results  Component Value Date   ALT 28 06/25/2022   AST 23 06/25/2022   ALKPHOS 78 06/25/2022   BILITOT 0.6 06/25/2022   Lab Results  Component Value Date   CHOL 184 06/25/2022   Lab Results  Component Value Date   HDL 43.80 06/25/2022   Lab Results  Component Value Date   LDLCALC 120 (H) 06/25/2022   Lab Results  Component Value Date   TRIG  100.0 06/25/2022   Lab Results  Component Value Date   CHOLHDL 4 06/25/2022   Lab Results  Component Value Date   PSA 0.28 06/25/2022   PSA 0.43 06/25/2021   PSA 0.34 06/12/2020   Lab Results  Component Value Date   HGBA1C 5.4 03/08/2023   HGBA1C 5.4 03/08/2023   HGBA1C 5.4 (A) 03/08/2023   HGBA1C 5.4 03/08/2023   ASSESSMENT AND PLAN:   Health maintenance exam: Reviewed age and gender appropriate health maintenance issues (prudent diet, regular exercise, health risks of tobacco and excessive alcohol, use of seatbelts, fire alarms in home, use of sunscreen).  Also reviewed age and gender appropriate health screening as well as vaccine recommendations. Vaccines: flu->today Shingrix declined. Otherwise all utd. Labs: fasting HP, PSA, point-of-care hemoglobin A1c. Prostate ca screening: PSA. Colon ca screening: he has not had initial screening-> referred to GI again today.  DM well-controlled on Mounjaro 12.5 mg daily, managed by Dr. Earlene Plater [facility]. POC Hba1c today is 5.4%.  Hypertension well-controlled. Hypercholesterolemia, history of statin intolerance/myalgia.  He takes Zetia with self-admitted moderate noncompliance.  An After Visit Summary was printed and given to the patient.  FOLLOW UP:  Return in about 6 months (around 12/28/2023) for routine chronic illness f/u.  Signed:  Santiago Bumpers, MD           06/29/2023

## 2023-07-12 ENCOUNTER — Encounter: Payer: BC Managed Care – PPO | Admitting: Family Medicine

## 2023-07-18 ENCOUNTER — Other Ambulatory Visit: Payer: Self-pay | Admitting: Family Medicine

## 2023-07-26 DIAGNOSIS — E66811 Obesity, class 1: Secondary | ICD-10-CM | POA: Diagnosis not present

## 2023-07-26 DIAGNOSIS — E118 Type 2 diabetes mellitus with unspecified complications: Secondary | ICD-10-CM | POA: Diagnosis not present

## 2023-07-26 DIAGNOSIS — E1169 Type 2 diabetes mellitus with other specified complication: Secondary | ICD-10-CM | POA: Diagnosis not present

## 2023-07-26 DIAGNOSIS — E65 Localized adiposity: Secondary | ICD-10-CM | POA: Diagnosis not present

## 2024-01-05 ENCOUNTER — Ambulatory Visit: Payer: BC Managed Care – PPO | Admitting: Family Medicine

## 2024-01-07 DIAGNOSIS — K5903 Drug induced constipation: Secondary | ICD-10-CM | POA: Diagnosis not present

## 2024-01-07 DIAGNOSIS — I152 Hypertension secondary to endocrine disorders: Secondary | ICD-10-CM | POA: Diagnosis not present

## 2024-01-07 DIAGNOSIS — E118 Type 2 diabetes mellitus with unspecified complications: Secondary | ICD-10-CM | POA: Diagnosis not present

## 2024-01-07 DIAGNOSIS — E1169 Type 2 diabetes mellitus with other specified complication: Secondary | ICD-10-CM | POA: Diagnosis not present

## 2024-02-09 ENCOUNTER — Other Ambulatory Visit: Payer: Self-pay | Admitting: Family Medicine

## 2024-05-05 ENCOUNTER — Other Ambulatory Visit: Payer: Self-pay | Admitting: Family Medicine

## 2024-05-09 ENCOUNTER — Encounter: Payer: Self-pay | Admitting: Family Medicine

## 2024-05-09 NOTE — Telephone Encounter (Signed)
 No further action needed.

## 2024-06-15 DIAGNOSIS — K5900 Constipation, unspecified: Secondary | ICD-10-CM | POA: Diagnosis not present

## 2024-06-15 DIAGNOSIS — E66811 Obesity, class 1: Secondary | ICD-10-CM | POA: Diagnosis not present

## 2024-06-15 DIAGNOSIS — E1169 Type 2 diabetes mellitus with other specified complication: Secondary | ICD-10-CM | POA: Diagnosis not present

## 2024-06-15 DIAGNOSIS — E118 Type 2 diabetes mellitus with unspecified complications: Secondary | ICD-10-CM | POA: Diagnosis not present

## 2024-07-04 ENCOUNTER — Encounter: Admitting: Family Medicine

## 2024-08-31 ENCOUNTER — Other Ambulatory Visit: Payer: Self-pay | Admitting: Family Medicine

## 2024-09-13 ENCOUNTER — Other Ambulatory Visit: Payer: Self-pay | Admitting: Family Medicine

## 2024-10-03 ENCOUNTER — Encounter: Admitting: Family Medicine

## 2024-10-05 ENCOUNTER — Other Ambulatory Visit: Payer: Self-pay | Admitting: Family Medicine

## 2024-10-05 NOTE — Telephone Encounter (Addendum)
 Pt has not had OV with our office since 2024. 15 day supply sent and message sent via MyChart to schedule appt. Pt previously received 30 day on 12/31.

## 2024-10-13 ENCOUNTER — Other Ambulatory Visit: Payer: Self-pay | Admitting: Family Medicine

## 2024-11-02 ENCOUNTER — Encounter: Admitting: Family Medicine
